# Patient Record
Sex: Female | Born: 1975
Health system: Southern US, Community
[De-identification: ages and names within clinical notes are randomized; demographics above are authoritative.]

## PROBLEM LIST (undated history)

## (undated) DIAGNOSIS — D649 Anemia, unspecified: Secondary | ICD-10-CM

## (undated) DIAGNOSIS — F419 Anxiety disorder, unspecified: Secondary | ICD-10-CM

## (undated) DIAGNOSIS — D219 Benign neoplasm of connective and other soft tissue, unspecified: Secondary | ICD-10-CM

## (undated) DIAGNOSIS — F32A Depression, unspecified: Secondary | ICD-10-CM

## (undated) DIAGNOSIS — F329 Major depressive disorder, single episode, unspecified: Secondary | ICD-10-CM

## (undated) HISTORY — PX: RECTAL EXAMINATION UNDER ANESTHESIA W/ CYSTOSCOPY: SHX2307

## (undated) HISTORY — DX: Anemia, unspecified: D64.9

## (undated) HISTORY — PX: MYOMECTOMY: SHX85

## (undated) HISTORY — DX: Major depressive disorder, single episode, unspecified: F32.9

## (undated) HISTORY — DX: Benign neoplasm of connective and other soft tissue, unspecified: D21.9

## (undated) HISTORY — DX: Anxiety disorder, unspecified: F41.9

## (undated) HISTORY — DX: Depression, unspecified: F32.A

---

## 1999-07-11 ENCOUNTER — Emergency Department (HOSPITAL_COMMUNITY): Admission: EM | Admit: 1999-07-11 | Discharge: 1999-07-11 | Payer: Self-pay | Admitting: Emergency Medicine

## 2002-08-05 ENCOUNTER — Emergency Department (HOSPITAL_COMMUNITY): Admission: EM | Admit: 2002-08-05 | Discharge: 2002-08-05 | Payer: Self-pay | Admitting: Emergency Medicine

## 2004-04-11 ENCOUNTER — Emergency Department (HOSPITAL_COMMUNITY): Admission: EM | Admit: 2004-04-11 | Discharge: 2004-04-11 | Payer: Self-pay | Admitting: Family Medicine

## 2004-04-13 ENCOUNTER — Emergency Department (HOSPITAL_COMMUNITY): Admission: EM | Admit: 2004-04-13 | Discharge: 2004-04-14 | Payer: Self-pay | Admitting: Emergency Medicine

## 2004-05-28 ENCOUNTER — Emergency Department (HOSPITAL_COMMUNITY): Admission: EM | Admit: 2004-05-28 | Discharge: 2004-05-28 | Payer: Self-pay | Admitting: *Deleted

## 2004-10-15 ENCOUNTER — Emergency Department (HOSPITAL_COMMUNITY): Admission: EM | Admit: 2004-10-15 | Discharge: 2004-10-15 | Payer: Self-pay | Admitting: Emergency Medicine

## 2004-12-09 ENCOUNTER — Emergency Department (HOSPITAL_COMMUNITY): Admission: EM | Admit: 2004-12-09 | Discharge: 2004-12-09 | Payer: Self-pay | Admitting: Emergency Medicine

## 2004-12-10 ENCOUNTER — Inpatient Hospital Stay (HOSPITAL_COMMUNITY): Admission: RE | Admit: 2004-12-10 | Discharge: 2004-12-13 | Payer: Self-pay | Admitting: General Surgery

## 2009-10-16 ENCOUNTER — Emergency Department (HOSPITAL_COMMUNITY): Admission: EM | Admit: 2009-10-16 | Discharge: 2009-10-16 | Payer: Self-pay | Admitting: Family Medicine

## 2009-11-13 ENCOUNTER — Ambulatory Visit: Payer: Self-pay | Admitting: Internal Medicine

## 2009-11-13 ENCOUNTER — Encounter (INDEPENDENT_AMBULATORY_CARE_PROVIDER_SITE_OTHER): Payer: Self-pay | Admitting: Family Medicine

## 2009-11-13 LAB — CONVERTED CEMR LAB
AST: 16 units/L (ref 0–37)
Albumin: 4.5 g/dL (ref 3.5–5.2)
Alkaline Phosphatase: 49 units/L (ref 39–117)
BUN: 8 mg/dL (ref 6–23)
Basophils Absolute: 0 10*3/uL (ref 0.0–0.1)
Creatinine, Ser: 0.58 mg/dL (ref 0.40–1.20)
Eosinophils Relative: 2 % (ref 0–5)
HCT: 40.8 % (ref 36.0–46.0)
HDL: 55 mg/dL (ref >39)
LDL Cholesterol: 95 mg/dL (ref 0–99)
Lymphocytes Relative: 32 % (ref 12–46)
Platelets: 268 10*3/uL (ref 150–400)
Potassium: 4.2 meq/L (ref 3.5–5.3)
RDW: 14.2 % (ref 11.5–15.5)
TSH: 0.919 microintl units/mL (ref 0.350–4.500)
Total CHOL/HDL Ratio: 2.9
VLDL: 12 mg/dL (ref 0–40)

## 2009-12-31 ENCOUNTER — Ambulatory Visit: Payer: Self-pay | Admitting: Internal Medicine

## 2010-02-03 ENCOUNTER — Emergency Department (HOSPITAL_COMMUNITY): Admission: EM | Admit: 2010-02-03 | Discharge: 2010-02-03 | Payer: Self-pay | Admitting: Family Medicine

## 2010-08-24 ENCOUNTER — Emergency Department (HOSPITAL_COMMUNITY)
Admission: EM | Admit: 2010-08-24 | Discharge: 2010-08-24 | Payer: Self-pay | Source: Home / Self Care | Admitting: Family Medicine

## 2010-09-22 ENCOUNTER — Encounter (INDEPENDENT_AMBULATORY_CARE_PROVIDER_SITE_OTHER): Payer: Self-pay | Admitting: *Deleted

## 2010-10-31 ENCOUNTER — Encounter (INDEPENDENT_AMBULATORY_CARE_PROVIDER_SITE_OTHER): Payer: Self-pay | Admitting: *Deleted

## 2010-10-31 LAB — CONVERTED CEMR LAB
ALT: 9 units/L (ref 0–35)
Alkaline Phosphatase: 45 units/L (ref 39–117)
LDL Cholesterol: 121 mg/dL — ABNORMAL HIGH (ref 0–99)
MCHC: 32 g/dL (ref 30.0–36.0)
MCV: 95.4 fL (ref 78.0–100.0)
Platelets: 227 10*3/uL (ref 150–400)
Sodium: 139 meq/L (ref 135–145)
Total Bilirubin: 0.4 mg/dL (ref 0.3–1.2)
Total CHOL/HDL Ratio: 3.5
Total Protein: 8 g/dL (ref 6.0–8.3)
Triglycerides: 65 mg/dL (ref <150)
VLDL: 13 mg/dL (ref 0–40)

## 2010-12-23 LAB — CULTURE, ROUTINE-ABSCESS: Gram Stain: NONE SEEN

## 2011-01-20 ENCOUNTER — Emergency Department (HOSPITAL_COMMUNITY)
Admission: EM | Admit: 2011-01-20 | Discharge: 2011-01-20 | Disposition: A | Discharge status: Home / Self Care | Payer: Self-pay | Attending: Emergency Medicine | Admitting: Emergency Medicine

## 2011-01-20 DIAGNOSIS — J029 Acute pharyngitis, unspecified: Secondary | ICD-10-CM | POA: Insufficient documentation

## 2011-02-27 NOTE — Discharge Summary (Signed)
NAMECHLOE, [name]            ACCOUNT NO.:  [id number]   MEDICAL RECORD NO.:  [id number]          PATIENT TYPE:  INP   LOCATION:  0373                         FACILITY:  [facility]   PHYSICIAN:  Anselm Pancoast. Weatherly, M.D.DATE OF BIRTH:  01-Jan-1976   DATE OF ADMISSION:  12/10/2004  DATE OF DISCHARGE:  12/13/2004                                 DISCHARGE SUMMARY   DISCHARGE DIAGNOSIS:  Complex perirectal abscess, ischiorectal component.   OPERATION:  Drainage of complex multiloculated perirectal abscess with  ischiorectal extension.   HISTORY:  [name] is a 35 year old black female who started having  pain and flu like symptoms about eight days ago and said that she has had a  history of previous constipation.  She was seen in the emergency room on  Friday and diagnosed as an anorectal tear and was placed on antibiotics plus  p.o. pain medicine.  She was asked to be seen in follow up in the office and  saw Dorisann Frames, M.D. on the day prior to coming to the ER for a CT scan.  She has a  horseshoe abscess on CT scan.  Vikki Ports, MD was on  call, had a very busy evening, and asked that I take care of the patient the  following day.  On examination, she had definite marked tenderness of the  perianal area and swelling.  She was febrile at 101 in the emergency room.  Her pulse was 86 and her white blood count was elevated to 19,800.  The  patient spent the night in the emergency room.  She had been started on  antibiotics and then the following morning, I saw her and added her to the  OR schedule promptly and at the time of surgery, she had a large perirectal  abscess that had a midline fistula in ano in internal opening and a probe  was placed opening it into the posterior perirectal abscess and then it went  around to the left and right and this was large enough, probably a lemon  size or larger in the perirectal with one area kind of going up into the  supralevator area.  I washed everything out, broke up the multiple  loculations between these various abscesses and placed two Robinson  catheters up in it for drainage and completed the surgery.  Postoperatively,  she was greatly improved.  We continued her on the broad antibiotic  coverage.  Her white count came down to normal, approximately less than  10,000 36 hours after surgery and she was ready for discharge and Sharlet Salina  T. Hoxworth, M.D. removed the little drains.  She was discharged to continue  on oral antibiotics and follow up in our office in 3-4 days.  She was  discharged on ampicillin 500 mg q.i.d. for 10 days, Flagyl 500 mg also  q.i.d. for 10 days, and Vicodin for pain.  She will see me in the office on  Tuesday or Wednesday for follow up.  The cultures showed multiple organisms  consistent with a colonic origin.      WJW/MEDQ  D:  01/15/2005  T:  01/15/2005  Job:  [id number]

## 2011-02-27 NOTE — Op Note (Signed)
Sabrina [name], Sabrina [name]            ACCOUNT NO.:  [id number]   MEDICAL RECORD NO.:  [id number]          PATIENT TYPE:  INP   LOCATION:  0102                         FACILITY:  [facility]   PHYSICIAN:  Anselm Pancoast. Weatherly, M.D.DATE OF BIRTH:  1976-05-27   DATE OF PROCEDURE:  12/11/2004  DATE OF DISCHARGE:                                 OPERATIVE REPORT   PREOPERATIVE DIAGNOSIS:  Perirectal abscess, complex, with supralevator and  ischiorectal components.   OPERATION:  Drainage of complex perirectal abscess with supralevator and  ischiorectal extensions.   ANESTHESIA:  General.   SURGEON:  Anselm Pancoast. Zachery Dakins, M.D.   HISTORY:  Sabrina [name] is a 35 year old female who was seen in the  emergency room about 5 days ago who thought she had the flu.  She had fever,  had a lot of anal discomfort with kind of vague localizing symptoms, and was  seen by Dr. Effie Shy.  The patient states that he described a tear within the  anus and she was placed on Augmentin and pain medication and advised to see  a Careers adviser at [facility].  I am not sure why there was about a  3-day delay, but she was seen in the office yesterday by Dr. Lurene Shadow, who on  examination called and talked to the patient's father but then scheduled a  CT scan which was done at [facility] and this showed an obvious  supralevator and kind of an ischiorectal component predominantly on the left  with a little bit across the midline posterior and Dr. Luan Pulling was on  call.  The patient, however, had left the ER and gone to the cafeteria.  When Dr. Luan Pulling saw her, it was probably midnight or so, and she had  just eaten.  Anyway, she was started on IV antibiotics but surgery delayed  until a 6-hour period of time.  I was asked to see her this morning and on  examination, she is certainly having a lot of sphincter spasm and there is  frank purulence coming from the anus.  Of course, I reviewed the CT and  could see the  large components up high in the perirectal area.  I discussed  with her that I would be doing her drainage and discussed with her that this  would require an internal sphincterotomy or possibly not being able to  control gas or solid or liquid stool initially to drain this area.  The  patient was in agreement and she was given an additional dose of Cipro and  taken to the operative suite.   DESCRIPTION OF PROCEDURE:  First she was placed up in stirrups after  induction of general anesthesia and you could see the frank purulence coming  from a midline fistula in ano opening.  First a probe was placed in this and  it does go up into the perirectal space high.  There is very little, if any,  actual extension in the perianal area like typical perirectal abscesses.  I  made a little opening posterior midline and actually went around the  internal sphincter area and then this completely divided  that so I could  then get my finger in and really with an [employer] retracted this up and  there was a large, probably lemon size or larger, frank pocket of purulence  to the left posterior.  I divided some of the smaller area of the  musculature of the pelvic floor so that there would be good drainage of this  and then placed a Robinson catheter up in this area and irrigated it while  looking into the anus and I do not see a communication in the perirectal  area.  I think this all started at the dentate line posterior midline.  As  far as the area to the right side, I cannot feel any other actual pockets  and I think that the communication does go posterior so I think that this  adequately drains the area.  I did place two 3/4-inch Penrose drains up into  the perirectal area up into the supralevator space and then sutured it to  the anal mucosa with 2-0 chromics to hopefully keep the Penrose drains in  for a couple of days or so to allow the tract to be established.  I may  elect to do a CT in a couple  of days for followup to make sure that there  are no other areas that are not drained according to how clinically she is  doing.  I then had the anesthesiologist kind of cut off the anesthetic  agents and waited so that she was getting good muscular time to make sure  that I do have the internal sphincter completely drained so that this will  have free drainage access and I am comfortable with that.  I was  conservative on dividing the sphincter so that hopefully we will have  continence in just a few days providing it is not gas or diarrhea.  With the  patient awakened, there was no evidence of any other frank pus draining out  as if there were any undrained areas.  The patient tolerated the procedure  nicely.  I am going to keep her on Cipro and add Flagyl orally and will make  the decision tomorrow whether she can possibly be discharged or whether she  will need another 48 hours on antibiotics.      WJW/MEDQ  D:  12/11/2004  T:  12/11/2004  Job:  [id number]

## 2011-11-11 ENCOUNTER — Emergency Department (HOSPITAL_COMMUNITY): Payer: Self-pay

## 2011-11-11 ENCOUNTER — Encounter (HOSPITAL_COMMUNITY): Payer: Self-pay | Admitting: *Deleted

## 2011-11-11 ENCOUNTER — Emergency Department (HOSPITAL_COMMUNITY)
Admission: EM | Admit: 2011-11-11 | Discharge: 2011-11-11 | Disposition: A | Discharge status: Home / Self Care | Payer: Self-pay | Attending: Emergency Medicine | Admitting: Emergency Medicine

## 2011-11-11 DIAGNOSIS — R002 Palpitations: Secondary | ICD-10-CM | POA: Insufficient documentation

## 2011-11-11 DIAGNOSIS — N390 Urinary tract infection, site not specified: Secondary | ICD-10-CM | POA: Insufficient documentation

## 2011-11-11 DIAGNOSIS — Z79899 Other long term (current) drug therapy: Secondary | ICD-10-CM | POA: Insufficient documentation

## 2011-11-11 DIAGNOSIS — R0602 Shortness of breath: Secondary | ICD-10-CM | POA: Insufficient documentation

## 2011-11-11 DIAGNOSIS — R110 Nausea: Secondary | ICD-10-CM | POA: Insufficient documentation

## 2011-11-11 DIAGNOSIS — F411 Generalized anxiety disorder: Secondary | ICD-10-CM | POA: Insufficient documentation

## 2011-11-11 LAB — URINALYSIS, ROUTINE W REFLEX MICROSCOPIC
Leukocytes, UA: NEGATIVE
Specific Gravity, Urine: 1.02 (ref 1.005–1.030)
Urobilinogen, UA: 1 mg/dL (ref 0.0–1.0)

## 2011-11-11 LAB — POCT I-STAT, CHEM 8
Calcium, Ion: 1.21 mmol/L (ref 1.12–1.32)
Chloride: 105 mEq/L (ref 96–112)
Glucose, Bld: 78 mg/dL (ref 70–99)
HCT: 36 % (ref 36.0–46.0)
Hemoglobin: 12.2 g/dL (ref 12.0–15.0)
TCO2: 26 mmol/L (ref 0–100)

## 2011-11-11 LAB — URINE MICROSCOPIC-ADD ON

## 2011-11-11 LAB — PREGNANCY, URINE: Preg Test, Ur: NEGATIVE

## 2011-11-11 LAB — D-DIMER, QUANTITATIVE: D-Dimer, Quant: 0.22 ug/mL-FEU (ref 0.00–0.48)

## 2011-11-11 MED ORDER — CEPHALEXIN 500 MG PO CAPS: 500.0000 mg | ORAL_CAPSULE | Freq: Four times a day (QID) | ORAL | Status: AC

## 2011-11-11 NOTE — ED Notes (Signed)
Pt worried about possible pregnancy and taking effexor.

## 2011-11-11 NOTE — ED Notes (Signed)
Pt reports onset last night of palpitations and feeling sob. Pt is afraid that its related to possible pregnancy while taking her effexor? No resp distress noted at triage, speaking in full sentences.

## 2011-11-11 NOTE — ED Provider Notes (Signed)
History     CSN: [id number]  Arrival date & time 11/11/11  1153   First MD Initiated Contact with Patient 11/11/11 1226      Chief Complaint  Patient presents with  . Shortness of Breath  . Palpitations    (Consider location/radiation/quality/duration/timing/severity/associated sxs/prior treatment) HPI Comments: Patient with intermittent palpitations and shortness of breath that started last night. She has episodes of heart racing it lasted for several minutes at a time. They're associated with anxiety and nausea. She denies any chest pain. Denies any cough or fever. She is worried that she might be pregnant because she is on Effexor. Denies abdominal pain, nausea vomiting. No irregular vaginal bleeding. No leg pain or swelling. Denies any caffeine or energy drink use.  The history is provided by the patient and the spouse.    History reviewed. No pertinent past medical history.  History reviewed. No pertinent past surgical history.  History reviewed. No pertinent family history.  History  Substance Use Topics  . Smoking status: Current Everyday Smoker    Types: Cigarettes  . Smokeless tobacco: Not on file  . Alcohol Use: No    OB History    Grav Para Term Preterm Abortions TAB SAB Ect Mult Living                  Review of Systems  Constitutional: Negative for fever, activity change and appetite change.  HENT: Negative for congestion and rhinorrhea.   Respiratory: Positive for shortness of breath.   Cardiovascular: Positive for palpitations. Negative for chest pain.  Gastrointestinal: Negative for nausea, vomiting and abdominal pain.  Genitourinary: Negative for dysuria and hematuria.  Skin: Negative for rash.  Neurological: Negative for headaches.    Allergies  Review of patient's allergies indicates no known allergies.  Home Medications   Current Outpatient Rx  Name Route Sig Dispense Refill  . ADULT MULTIVITAMIN W/MINERALS CH Oral Take 1 tablet by mouth  at bedtime.    . VENLAFAXINE HCL ER 37.5 MG PO CP24 Oral Take 37.5 mg by mouth daily.    . CEPHALEXIN 500 MG PO CAPS Oral Take 1 capsule (500 mg total) by mouth 4 (four) times daily. 40 capsule 0    BP 95/57  Pulse 73  Temp(Src) 98.5 F (36.9 C) (Oral)  Resp 20  SpO2 100%  LMP 10/27/2011  Physical Exam  Constitutional: She is oriented to person, place, and time. She appears well-developed and well-nourished. No distress.  HENT:  Head: Normocephalic and atraumatic.  Mouth/Throat: Oropharynx is clear and moist. No oropharyngeal exudate.  Eyes: Conjunctivae are normal. Pupils are equal, round, and reactive to light.  Neck: Normal range of motion.  Cardiovascular: Normal rate, regular rhythm and normal heart sounds.   No murmur heard. Pulmonary/Chest: Effort normal and breath sounds normal. No respiratory distress.  Abdominal: Soft. There is no tenderness. There is no rebound and no guarding.  Musculoskeletal: Normal range of motion. She exhibits no edema and no tenderness.  Neurological: She is alert and oriented to person, place, and time. No cranial nerve deficit.  Skin: Skin is warm.    ED Course  Procedures (including critical care time)  Labs Reviewed  URINALYSIS, ROUTINE W REFLEX MICROSCOPIC - Abnormal; Notable for the following:    APPearance CLOUDY (*)    Hgb urine dipstick MODERATE (*)    Nitrite POSITIVE (*)    All other components within normal limits  URINE MICROSCOPIC-ADD ON - Abnormal; Notable for the following:  Squamous Epithelial / LPF FEW (*)    Bacteria, UA MANY (*)    All other components within normal limits  PREGNANCY, URINE  D-DIMER, QUANTITATIVE  POCT I-STAT, CHEM 8  TSH  T4, FREE   Dg Chest 2 View  11/11/2011  *RADIOLOGY REPORT*  Clinical Data: Tachycardia.  Short of breath.  CHEST - 2 VIEW  Comparison: None.  Findings:  Cardiopericardial silhouette within normal limits. Mediastinal contours normal. Trachea midline.  No airspace disease or  effusion.  IMPRESSION: No active cardiopulmonary disease.  Original Report Authenticated By: Andreas Newport, M.D.     1. Palpitations   2. Urinary tract infection       MDM  Intermittent palpitations shortness of breath. No respiratory distress, no chest pain, lungs clear.  Chest x-ray negative. D-dimer negative. No further palpitations.  Urinalysis with infection. Will treat. Discussed with patient followup with PCP in cardiology as needed for possible Holter monitor if palpitations persist.   Date: 11/11/2011  Rate: 74  Rhythm: normal sinus rhythm  QRS Axis: normal  Intervals: PR prolonged  ST/T Wave abnormalities: normal  Conduction Disutrbances:none  Narrative Interpretation:   Old EKG Reviewed: none available          Glynn Octave, MD 11/11/11 [phone]

## 2012-11-25 ENCOUNTER — Emergency Department (HOSPITAL_COMMUNITY): Payer: Self-pay

## 2012-11-25 ENCOUNTER — Emergency Department (HOSPITAL_COMMUNITY)
Admission: EM | Admit: 2012-11-25 | Discharge: 2012-11-25 | Disposition: A | Discharge status: Home / Self Care | Payer: Self-pay | Attending: Emergency Medicine | Admitting: Emergency Medicine

## 2012-11-25 ENCOUNTER — Encounter (HOSPITAL_COMMUNITY): Payer: Self-pay | Admitting: Emergency Medicine

## 2012-11-25 DIAGNOSIS — Z3202 Encounter for pregnancy test, result negative: Secondary | ICD-10-CM | POA: Insufficient documentation

## 2012-11-25 DIAGNOSIS — D259 Leiomyoma of uterus, unspecified: Secondary | ICD-10-CM

## 2012-11-25 DIAGNOSIS — F17200 Nicotine dependence, unspecified, uncomplicated: Secondary | ICD-10-CM | POA: Insufficient documentation

## 2012-11-25 DIAGNOSIS — N898 Other specified noninflammatory disorders of vagina: Secondary | ICD-10-CM | POA: Insufficient documentation

## 2012-11-25 LAB — WET PREP, GENITAL
Trich, Wet Prep: NONE SEEN
Yeast Wet Prep HPF POC: NONE SEEN

## 2012-11-25 LAB — URINALYSIS, ROUTINE W REFLEX MICROSCOPIC
Bilirubin Urine: NEGATIVE
Glucose, UA: NEGATIVE mg/dL
Specific Gravity, Urine: 1.024 (ref 1.005–1.030)
pH: 6 (ref 5.0–8.0)

## 2012-11-25 LAB — POCT PREGNANCY, URINE: Preg Test, Ur: NEGATIVE

## 2012-11-25 MED ORDER — METRONIDAZOLE 500 MG PO TABS: 500.0000 mg | ORAL_TABLET | Freq: Two times a day (BID) | ORAL | Status: DC

## 2012-11-25 NOTE — ED Notes (Signed)
Pt st's she has had a knot at her umbilicus for 1 yr.  Pt denies any pain or discomfort, st's she just wanted to get it checked and get an x-ray.

## 2012-11-25 NOTE — ED Provider Notes (Signed)
History    This chart was scribed for non-physician practitioner working with Carleene Cooper III, MD by Frederik Pear, ED Scribe. This patient was seen in room TR09C/TR09C and the patient's care was started at 1708.   CSN: [id number]  Arrival date & time 11/25/12  1544   First MD Initiated Contact with Patient 11/25/12 1708      Chief Complaint  Patient presents with  . Abdominal Pain    (Consider location/radiation/quality/duration/timing/severity/associated sxs/prior treatment) The history is provided by the patient. No language interpreter was used.    Sabrina [name] is a 37 y.o. female who presents to the Emergency Department complaining of gradual onset, gradually worsening, non-radiating, area of firmness without pain near the suprapubic region that gets more firm during her monthly period and began a year ago. She also complains of constant, gradually worsening vaginal discharge that began a year ago. She reports that she was treated for trichomoniasis earlier in the year, and the vaginal discharge seemed to improved for awhile before gradually worsening. She reports that at the time she was treated that she was in a monogamous relationship with a same sex partner that has since ended. She denies any constipation, diarrhea, hematochezia, vaginal bleeding, or emesis. She has a h/o of heavy monthly periods, but denies any previous diagnosis of fibroids.   No OBGYN or PCP.   History reviewed. No pertinent past medical history.  Past Surgical History  Procedure Laterality Date  . Rectal examination under anesthesia w/ cystoscopy      No family history on file.  History  Substance Use Topics  . Smoking status: Current Every Day Smoker    Types: Cigarettes  . Smokeless tobacco: Not on file  . Alcohol Use: No    OB History   Grav Para Term Preterm Abortions TAB SAB Ect Mult Living                  Review of Systems  Constitutional: Negative for fever.   Gastrointestinal: Negative for vomiting, constipation and blood in stool.  Genitourinary: Positive for vaginal discharge.  All other systems reviewed and are negative.    Allergies  Review of patient's allergies indicates no known allergies.  Home Medications   Current Outpatient Rx  Name  Route  Sig  Dispense  Refill  . ibuprofen (ADVIL,MOTRIN) 200 MG tablet   Oral   Take 400 mg by mouth every 6 (six) hours as needed for pain.         . Multiple Vitamin (MULITIVITAMIN WITH MINERALS) TABS   Oral   Take 1 tablet by mouth daily.            BP 111/70  Pulse 60  Temp(Src) 98.3 F (36.8 C) (Oral)  Resp 16  SpO2 98%  LMP 11/18/2012  Physical Exam  Nursing note and vitals reviewed. Constitutional: She is oriented to person, place, and time. She appears well-developed and well-nourished.  HENT:  Head: Normocephalic and atraumatic.  Neck: Normal range of motion.  Pulmonary/Chest: Effort normal. No respiratory distress.  Abdominal: Soft. Bowel sounds are normal. She exhibits mass. She exhibits no distension. There is no tenderness. There is no rebound and no guarding.  Genitourinary: Pelvic exam was performed with patient supine. There is no rash on the right labia. There is no rash on the left labia. Uterus is enlarged (mass). Uterus is not tender. Cervix exhibits no motion tenderness and no discharge. Right adnexum displays no mass and no tenderness. Left adnexum displays no  mass and no tenderness. Vaginal discharge (thick white) found.  Musculoskeletal: Normal range of motion.  Neurological: She is alert and oriented to person, place, and time.  Skin: Skin is warm.  Psychiatric: She has a normal mood and affect. Thought content normal.    ED Course  Procedures (including critical care time)  DIAGNOSTIC STUDIES: Oxygen Saturation is 98% on room air, normal by my interpretation.    COORDINATION OF CARE:  17:36- Discussed planned course of treatment with the patient,  including a GC/chlamydia probe, who is agreeable at this time.  18:23- Ordered a transvaginal ultrasound.  Results for orders placed during the hospital encounter of 11/25/12  WET PREP, GENITAL      Result Value Range   Yeast Wet Prep HPF POC NONE SEEN  NONE SEEN   Trich, Wet Prep NONE SEEN  NONE SEEN   Clue Cells Wet Prep HPF POC FEW (*) NONE SEEN   WBC, Wet Prep HPF POC FEW (*) NONE SEEN  URINALYSIS, ROUTINE W REFLEX MICROSCOPIC      Result Value Range   Color, Urine YELLOW  YELLOW   APPearance CLOUDY (*) CLEAR   Specific Gravity, Urine 1.024  1.005 - 1.030   pH 6.0  5.0 - 8.0   Glucose, UA NEGATIVE  NEGATIVE mg/dL   Hgb urine dipstick MODERATE (*) NEGATIVE   Bilirubin Urine NEGATIVE  NEGATIVE   Ketones, ur NEGATIVE  NEGATIVE mg/dL   Protein, ur NEGATIVE  NEGATIVE mg/dL   Urobilinogen, UA 0.2  0.0 - 1.0 mg/dL   Nitrite POSITIVE (*) NEGATIVE   Leukocytes, UA NEGATIVE  NEGATIVE  URINE MICROSCOPIC-ADD ON      Result Value Range   Squamous Epithelial / LPF MANY (*) RARE   WBC, UA 0-2  <3 WBC/hpf   RBC / HPF 3-6  <3 RBC/hpf   Bacteria, UA MANY (*) RARE  POCT PREGNANCY, URINE      Result Value Range   Preg Test, Ur NEGATIVE  NEGATIVE     Labs Reviewed  WET PREP, GENITAL - Abnormal; Notable for the following:    Clue Cells Wet Prep HPF POC FEW (*)    WBC, Wet Prep HPF POC FEW (*)    All other components within normal limits  URINALYSIS, ROUTINE W REFLEX MICROSCOPIC - Abnormal; Notable for the following:    APPearance CLOUDY (*)    Hgb urine dipstick MODERATE (*)    Nitrite POSITIVE (*)    All other components within normal limits  URINE MICROSCOPIC-ADD ON - Abnormal; Notable for the following:    Squamous Epithelial / LPF MANY (*)    Bacteria, UA MANY (*)    All other components within normal limits  GC/CHLAMYDIA PROBE AMP  POCT PREGNANCY, URINE   US Pelvis Complete  11/25/2012  *RADIOLOGY REPORT*  Clinical Data: Uterine mass.  Question enlarged fibroid.   TRANSABDOMINAL ULTRASOUND OF PELVIS  Technique: Transabdominal ultrasound examination of the pelvis was performed including evaluation of the uterus, ovaries, adnexal regions, and pelvic cul-de-sac.  Comparison: CT 12/10/2004  Findings:  Uterus: 13.6 x 9.3 x 10.4 cm.  Multiple fibroids throughout the uterus.  The largest is in the right myometrium measuring up to 5.7 cm.  Another large fibroid in the fundus at the midline measuring up to 5.5 cm. Multiple other fibroids from 2-5 cm.  Endometrium: Not well visualized due to heterogeneous echotexture throughout the uterus and fibroids.  Right Ovary: 3.1 x 2.0 x 2.4 cm. Normal size and echotexture.  No adnexal masses.  Left Ovary: 4.3 x 1.6 x 2.5 cm. Normal size and echotexture.  No adnexal masses.  Other Findings:  No free fluid.  IMPRESSION: Enlarged uterus with heterogeneous echotexture and multiple focal fibroids, the largest 5.7 cm.   Original Report Authenticated By: Charlett Nose, M.D.      1. Leiomyoma of uterus     8:07 PM Patient seen and examined.   Vital signs reviewed and are as follows: Filed Vitals:   11/25/12 1619  BP: 111/70  Pulse: 60  Temp: 98.3 F (36.8 C)  Resp: 16   [place] ordered to evaluate uterine mass.   Pelvic exam performed with nurse chaperone.  Findings reviewed. Patient informed. GYN followup/referral given.  Patient informed of bacterial vaginosis. Will treat.  Patient urged to return with worsening symptoms or any other concerns.   MDM  Patient with suprapubic mass, confirmed to be fibroids on pelvic ultrasound. No concern for obstruction. Abdomen is soft and nontender.  I personally performed the services described in this documentation, which was scribed in my presence. The recorded information has been reviewed and is accurate.        Renne Crigler, [place] 11/25/12 2055

## 2012-11-26 LAB — GC/CHLAMYDIA PROBE AMP
CT Probe RNA: NEGATIVE
GC Probe RNA: NEGATIVE

## 2012-11-28 NOTE — ED Provider Notes (Signed)
Medical screening examination/treatment/procedure(s) were performed by non-physician practitioner and as supervising physician I was immediately available for consultation/collaboration.   Carleene Cooper III, MD 11/28/12 [phone]

## 2013-07-17 ENCOUNTER — Other Ambulatory Visit (HOSPITAL_COMMUNITY)
Admission: RE | Admit: 2013-07-17 | Discharge: 2013-07-17 | Disposition: A | Discharge status: Home / Self Care | Payer: Self-pay | Source: Ambulatory Visit | Attending: Obstetrics and Gynecology | Admitting: Obstetrics and Gynecology

## 2013-07-17 ENCOUNTER — Other Ambulatory Visit: Payer: Self-pay | Admitting: Obstetrics and Gynecology

## 2013-07-17 DIAGNOSIS — Z01419 Encounter for gynecological examination (general) (routine) without abnormal findings: Secondary | ICD-10-CM | POA: Insufficient documentation

## 2013-07-17 DIAGNOSIS — Z113 Encounter for screening for infections with a predominantly sexual mode of transmission: Secondary | ICD-10-CM | POA: Insufficient documentation

## 2013-10-13 ENCOUNTER — Ambulatory Visit (INDEPENDENT_AMBULATORY_CARE_PROVIDER_SITE_OTHER): Payer: 59 | Admitting: Physician Assistant

## 2013-10-13 VITALS — BP 108/62 | HR 62 | Temp 98.1°F | Resp 18 | Ht 64.0 in | Wt 150.0 lb

## 2013-10-13 DIAGNOSIS — D219 Benign neoplasm of connective and other soft tissue, unspecified: Secondary | ICD-10-CM | POA: Insufficient documentation

## 2013-10-13 DIAGNOSIS — G4700 Insomnia, unspecified: Secondary | ICD-10-CM

## 2013-10-13 DIAGNOSIS — F411 Generalized anxiety disorder: Secondary | ICD-10-CM

## 2013-10-13 MED ORDER — VENLAFAXINE HCL ER 37.5 MG PO CP24: 37.5000 mg | ORAL_CAPSULE | Freq: Every day | ORAL | Status: DC

## 2013-10-13 NOTE — Patient Instructions (Addendum)
Start with 1 pill a day and then if you are tolerating the medication after 7-10 days increase to 2 pills once a day - if you are having side effects stay at 1 pill/  Recheck with me in a month  Arvil Chaco - [phone]
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
## 2013-10-16 ENCOUNTER — Telehealth: Payer: Self-pay

## 2013-10-16 NOTE — Telephone Encounter (Signed)
Patient calling to let Sabrina [name] as requested, know how the medication venlafaxine XR (EFFEXOR XR) 37.5 MG 24 hr capsule Gave her symptoms of heart racing and was very unsettling for her around 2-4am. She was wondering if maybe she can be switched to another type of medication? Something more mild.     [phone]

## 2013-10-17 ENCOUNTER — Other Ambulatory Visit: Payer: Self-pay | Admitting: Radiology

## 2013-10-17 ENCOUNTER — Telehealth: Payer: Self-pay

## 2013-10-17 MED ORDER — BUPROPION HCL ER (XL) 150 MG PO TB24: 150.0000 mg | ORAL_TABLET | Freq: Every day | ORAL | Status: DC

## 2013-10-17 NOTE — Telephone Encounter (Signed)
Ok lets give it a try.  I think the increased heart rate is that side effect I was telling her about with the increased anxiety when starting on the effexor.  I have sent in the Wellbutrin that she requested to try and I will see her back before the medication runs out.

## 2013-10-17 NOTE — Telephone Encounter (Signed)
Patient calling again to speak to Bernestine Amass about previous message in regards to a reaction to her medication venlafaxine XR (EFFEXOR XR) 37.5 MG 24 hr capsule    [phone]

## 2013-10-17 NOTE — Telephone Encounter (Signed)
Patient reports side effects with the Effexor, heart racing. Please advise on alternative, advised her to d/c this medication.

## 2013-10-17 NOTE — Telephone Encounter (Signed)
She is interested in Wellbutrin instead , please advise. She states the heart racing has resolved, agrees to return to clinic if this returns. She will take it easy and increase fluids today.

## 2013-12-12 ENCOUNTER — Ambulatory Visit (INDEPENDENT_AMBULATORY_CARE_PROVIDER_SITE_OTHER): Payer: No Typology Code available for payment source | Admitting: Internal Medicine

## 2013-12-12 VITALS — BP 110/60 | HR 64 | Temp 98.1°F | Resp 16 | Ht 64.25 in | Wt 143.6 lb

## 2013-12-12 DIAGNOSIS — J029 Acute pharyngitis, unspecified: Secondary | ICD-10-CM

## 2013-12-12 DIAGNOSIS — R05 Cough: Secondary | ICD-10-CM

## 2013-12-12 DIAGNOSIS — J209 Acute bronchitis, unspecified: Secondary | ICD-10-CM

## 2013-12-12 DIAGNOSIS — Z20828 Contact with and (suspected) exposure to other viral communicable diseases: Secondary | ICD-10-CM

## 2013-12-12 DIAGNOSIS — R059 Cough, unspecified: Secondary | ICD-10-CM

## 2013-12-12 LAB — POCT INFLUENZA A/B
INFLUENZA A, POC: NEGATIVE
Influenza B, POC: NEGATIVE

## 2013-12-12 MED ORDER — HYDROCODONE-ACETAMINOPHEN 7.5-325 MG/15ML PO SOLN: ORAL | Status: DC

## 2013-12-12 MED ORDER — AZITHROMYCIN 500 MG PO TABS: 500.0000 mg | ORAL_TABLET | Freq: Every day | ORAL | Status: DC

## 2013-12-12 NOTE — Patient Instructions (Signed)
Sinusitis Sinusitis is redness, soreness, and swelling (inflammation) of the paranasal sinuses. Paranasal sinuses are air pockets within the bones of your face (beneath the eyes, the middle of the forehead, or above the eyes). In healthy paranasal sinuses, mucus is able to drain out, and air is able to circulate through them by way of your nose. However, when your paranasal sinuses are inflamed, mucus and air can become trapped. This can allow bacteria and other germs to grow and cause infection. Sinusitis can develop quickly and last only a short time (acute) or continue over a long period (chronic). Sinusitis that lasts for more than 12 weeks is considered chronic.  CAUSES  Causes of sinusitis include:  Allergies.  Structural abnormalities, such as displacement of the cartilage that separates your nostrils (deviated septum), which can decrease the air flow through your nose and sinuses and affect sinus drainage.  Functional abnormalities, such as when the small hairs (cilia) that line your sinuses and help remove mucus do not work properly or are not present. SYMPTOMS  Symptoms of acute and chronic sinusitis are the same. The primary symptoms are pain and pressure around the affected sinuses. Other symptoms include:  Upper toothache.  Earache.  Headache.  Bad breath.  Decreased sense of smell and taste.  A cough, which worsens when you are lying flat.  Fatigue.  Fever.  Thick drainage from your nose, which often is green and may contain pus (purulent).  Swelling and warmth over the affected sinuses. DIAGNOSIS  Your caregiver will perform a physical exam. During the exam, your caregiver may:  Look in your nose for signs of abnormal growths in your nostrils (nasal polyps).  Tap over the affected sinus to check for signs of infection.  View the inside of your sinuses (endoscopy) with a special imaging device with a light attached (endoscope), which is inserted into your  sinuses. If your caregiver suspects that you have chronic sinusitis, one or more of the following tests may be recommended:  Allergy tests.  Nasal culture A sample of mucus is taken from your nose and sent to a lab and screened for bacteria.  Nasal cytology A sample of mucus is taken from your nose and examined by your caregiver to determine if your sinusitis is related to an allergy. TREATMENT  Most cases of acute sinusitis are related to a viral infection and will resolve on their own within 10 days. Sometimes medicines are prescribed to help relieve symptoms (pain medicine, decongestants, nasal steroid sprays, or saline sprays).  However, for sinusitis related to a bacterial infection, your caregiver will prescribe antibiotic medicines. These are medicines that will help kill the bacteria causing the infection.  Rarely, sinusitis is caused by a fungal infection. In theses cases, your caregiver will prescribe antifungal medicine. For some cases of chronic sinusitis, surgery is needed. Generally, these are cases in which sinusitis recurs more than 3 times per year, despite other treatments. HOME CARE INSTRUCTIONS   Drink plenty of water. Water helps thin the mucus so your sinuses can drain more easily.  Use a humidifier.  Inhale steam 3 to 4 times a day (for example, sit in the bathroom with the shower running).  Apply a warm, moist washcloth to your face 3 to 4 times a day, or as directed by your caregiver.  Use saline nasal sprays to help moisten and clean your sinuses.  Take over-the-counter or prescription medicines for pain, discomfort, or fever only as directed by your caregiver. SEEK IMMEDIATE MEDICAL   CARE IF:  You have increasing pain or severe headaches.  You have nausea, vomiting, or drowsiness.  You have swelling around your face.  You have vision problems.  You have a stiff neck.  You have difficulty breathing. MAKE SURE YOU:   Understand these  instructions.  Will watch your condition.  Will get help right away if you are not doing well or get worse. Document Released: 09/28/2005 Document Revised: 12/21/2011 Document Reviewed: 10/13/2011 [facility] Patient Information 2014 [place], [place]. Sore Throat A sore throat is pain, burning, irritation, or scratchiness of the throat. There is often pain or tenderness when swallowing or talking. A sore throat may be accompanied by other symptoms, such as coughing, sneezing, fever, and swollen neck glands. A sore throat is often the first sign of another sickness, such as a cold, flu, strep throat, or mononucleosis (commonly known as mono). Most sore throats go away without medical treatment. CAUSES  The most common causes of a sore throat include:  A viral infection, such as a cold, flu, or mono.  A bacterial infection, such as strep throat, tonsillitis, or whooping cough.  Seasonal allergies.  Dryness in the air.  Irritants, such as smoke or pollution.  Gastroesophageal reflux disease (GERD). HOME CARE INSTRUCTIONS   Only take over-the-counter medicines as directed by your caregiver.  Drink enough fluids to keep your urine clear or pale yellow.  Rest as needed.  Try using throat sprays, lozenges, or sucking on hard candy to ease any pain (if older than 4 years or as directed).  Sip warm liquids, such as broth, herbal tea, or warm water with honey to relieve pain temporarily. You may also eat or drink cold or frozen liquids such as frozen ice pops.  Gargle with salt water (mix 1 tsp salt with 8 oz of water).  Do not smoke and avoid secondhand smoke.  Put a cool-mist humidifier in your bedroom at night to moisten the air. You can also turn on a hot shower and sit in the bathroom with the door closed for 5 10 minutes. SEEK IMMEDIATE MEDICAL CARE IF:  You have difficulty breathing.  You are unable to swallow fluids, soft foods, or your saliva.  You have increased swelling in  the throat.  Your sore throat does not get better in 7 days.  You have nausea and vomiting.  You have a fever or persistent symptoms for more than 2 3 days.  You have a fever and your symptoms suddenly get worse. MAKE SURE YOU:   Understand these instructions.  Will watch your condition.  Will get help right away if you are not doing well or get worse. Document Released: 11/05/2004 Document Revised: 09/14/2012 Document Reviewed: 06/05/2012 [facility] Patient Information 2014 [place], [place].

## 2013-12-12 NOTE — Progress Notes (Signed)
   Subjective:    Patient ID: Sabrina [name], female    DOB: 1976-07-30, 38 y.o.   MRN: [id number]  HPI 38 YO female presents to the clinic today with "flu like" symptoms that started Sunday night. She states she is having a sore throat, congestion in her nasal area and chest, body aches, chills, sneezing. She has not checked her temperature but has had shivers and hot flashes. The largest complaint is her sore throat. Has cough and exposed to flu at work.  She denies any GI symptoms. She reports some soft stools and nausea but this is inconsistent.   She did not have her flu shot this year. She works at [employer] and may have been in contact with someone that has had the flu.     Review of Systems  Constitutional: Positive for chills and fatigue.  HENT: Positive for congestion and postnasal drip.   Respiratory: Positive for cough.   Genitourinary: Negative.        Objective:   Physical Exam  Vitals reviewed. Constitutional: She is oriented to person, place, and time. She appears well-developed and well-nourished. No distress.  HENT:  Head: Normocephalic.  Right Ear: External ear normal.  Left Ear: External ear normal.  Nose: Mucosal edema, rhinorrhea and sinus tenderness present.  Mouth/Throat: Posterior oropharyngeal erythema present.  Eyes: EOM are normal. Pupils are equal, round, and reactive to light.  Neck: Normal range of motion. Neck supple.  Cardiovascular: Normal rate.   Pulmonary/Chest: Effort normal and breath sounds normal.  Musculoskeletal: Normal range of motion.  Neurological: She is alert and oriented to person, place, and time. She exhibits normal muscle tone. Coordination normal.  Psychiatric: She has a normal mood and affect. Her behavior is normal. Thought content normal.    Results for orders placed in visit on 12/12/13  POCT INFLUENZA A/B      Result Value Ref Range   Influenza A, POC Negative     Influenza B, POC Negative             Assessment & Plan:  Cough/Pharyngitis Early bronchitis Zith 500mg Alvina Filbert

## 2013-12-13 ENCOUNTER — Other Ambulatory Visit (HOSPITAL_COMMUNITY): Payer: Self-pay | Admitting: Interventional Radiology

## 2013-12-13 DIAGNOSIS — D259 Leiomyoma of uterus, unspecified: Secondary | ICD-10-CM

## 2014-01-25 ENCOUNTER — Inpatient Hospital Stay: Admission: RE | Admit: 2014-01-25 | Payer: Self-pay | Source: Ambulatory Visit

## 2014-06-21 ENCOUNTER — Ambulatory Visit (INDEPENDENT_AMBULATORY_CARE_PROVIDER_SITE_OTHER): Payer: No Typology Code available for payment source | Admitting: Family Medicine

## 2014-06-21 VITALS — BP 108/64 | HR 60 | Temp 97.9°F | Resp 18 | Ht 64.0 in | Wt 142.6 lb

## 2014-06-21 DIAGNOSIS — A5901 Trichomonal vulvovaginitis: Secondary | ICD-10-CM

## 2014-06-21 DIAGNOSIS — R350 Frequency of micturition: Secondary | ICD-10-CM

## 2014-06-21 DIAGNOSIS — R5383 Other fatigue: Secondary | ICD-10-CM

## 2014-06-21 DIAGNOSIS — N3001 Acute cystitis with hematuria: Secondary | ICD-10-CM

## 2014-06-21 DIAGNOSIS — R5381 Other malaise: Secondary | ICD-10-CM

## 2014-06-21 DIAGNOSIS — D509 Iron deficiency anemia, unspecified: Secondary | ICD-10-CM

## 2014-06-21 DIAGNOSIS — D259 Leiomyoma of uterus, unspecified: Secondary | ICD-10-CM

## 2014-06-21 DIAGNOSIS — Z113 Encounter for screening for infections with a predominantly sexual mode of transmission: Secondary | ICD-10-CM

## 2014-06-21 DIAGNOSIS — IMO0001 Reserved for inherently not codable concepts without codable children: Secondary | ICD-10-CM

## 2014-06-21 DIAGNOSIS — N3000 Acute cystitis without hematuria: Secondary | ICD-10-CM

## 2014-06-21 DIAGNOSIS — M791 Myalgia, unspecified site: Secondary | ICD-10-CM

## 2014-06-21 LAB — POCT URINALYSIS DIPSTICK
Bilirubin, UA: NEGATIVE
Glucose, UA: NEGATIVE
NITRITE UA: POSITIVE
PH UA: 7
Protein, UA: NEGATIVE
SPEC GRAV UA: 1.015
UROBILINOGEN UA: 0.2

## 2014-06-21 LAB — POCT UA - MICROSCOPIC ONLY
CASTS, UR, LPF, POC: NEGATIVE
CRYSTALS, UR, HPF, POC: NEGATIVE
TRICHOMONAS UA: POSITIVE
Yeast, UA: NEGATIVE

## 2014-06-21 LAB — POCT CBC
GRANULOCYTE PERCENT: 54.1 % (ref 37–80)
HCT, POC: 38.7 % (ref 37.7–47.9)
HEMOGLOBIN: 12.3 g/dL (ref 12.2–16.2)
Lymph, poc: 3 (ref 0.6–3.4)
MCH, POC: 28.9 pg (ref 27–31.2)
MCHC: 31.7 g/dL — AB (ref 31.8–35.4)
MCV: 91.3 fL (ref 80–97)
MID (CBC): 0.5 (ref 0–0.9)
MPV: 7.5 fL (ref 0–99.8)
PLATELET COUNT, POC: 290 10*3/uL (ref 142–424)
POC GRANULOCYTE: 4.1 (ref 2–6.9)
POC LYMPH PERCENT: 39.7 %L (ref 10–50)
POC MID %: 6.2 % (ref 0–12)
RBC: 4.24 M/uL (ref 4.04–5.48)
RDW, POC: 19.3 %
WBC: 7.6 10*3/uL (ref 4.6–10.2)

## 2014-06-21 LAB — COMPREHENSIVE METABOLIC PANEL
ALK PHOS: 48 U/L (ref 39–117)
ALT: 10 U/L (ref 0–35)
AST: 17 U/L (ref 0–37)
Albumin: 4.2 g/dL (ref 3.5–5.2)
BILIRUBIN TOTAL: 0.3 mg/dL (ref 0.2–1.2)
BUN: 11 mg/dL (ref 6–23)
CO2: 26 meq/L (ref 19–32)
CREATININE: 0.87 mg/dL (ref 0.50–1.10)
Calcium: 9.4 mg/dL (ref 8.4–10.5)
Chloride: 103 mEq/L (ref 96–112)
GLUCOSE: 79 mg/dL (ref 70–99)
Potassium: 4.1 mEq/L (ref 3.5–5.3)
SODIUM: 133 meq/L — AB (ref 135–145)
TOTAL PROTEIN: 7.5 g/dL (ref 6.0–8.3)

## 2014-06-21 LAB — POCT WET PREP WITH KOH
CLUE CELLS WET PREP PER HPF POC: NEGATIVE
KOH Prep POC: NEGATIVE
TRICHOMONAS UA: POSITIVE
YEAST WET PREP PER HPF POC: NEGATIVE

## 2014-06-21 LAB — POCT SEDIMENTATION RATE: POCT SED RATE: 50 mm/h — AB (ref 0–22)

## 2014-06-21 LAB — C-REACTIVE PROTEIN

## 2014-06-21 LAB — CK: Total CK: 45 U/L (ref 7–177)

## 2014-06-21 LAB — GLUCOSE, POCT (MANUAL RESULT ENTRY): POC GLUCOSE: 88 mg/dL (ref 70–99)

## 2014-06-21 MED ORDER — CIPROFLOXACIN HCL 500 MG PO TABS: 500.0000 mg | ORAL_TABLET | Freq: Two times a day (BID) | ORAL | Status: DC

## 2014-06-21 MED ORDER — FLUCONAZOLE 150 MG PO TABS: 150.0000 mg | ORAL_TABLET | Freq: Once | ORAL | Status: DC

## 2014-06-21 MED ORDER — METRONIDAZOLE 500 MG PO TABS: 2000.0000 mg | ORAL_TABLET | Freq: Once | ORAL | Status: DC
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
## 2014-06-21 NOTE — Patient Instructions (Signed)
Trichomoniasis °Trichomoniasis is an infection caused by an organism called Trichomonas. The infection can affect both women and men. In women, the outer female genitalia and the vagina are affected. In men, the penis is mainly affected, but the prostate and other reproductive organs can also be involved. Trichomoniasis is a sexually transmitted infection (STI) and is most often passed to another person through sexual contact.  °RISK FACTORS °· Having unprotected sexual intercourse. °· Having sexual intercourse with an infected partner. °SIGNS AND SYMPTOMS  °Symptoms of trichomoniasis in women include: °· Abnormal gray-green frothy vaginal discharge. °· Itching and irritation of the vagina. °· Itching and irritation of the area outside the vagina. °Symptoms of trichomoniasis in men include:  °· Penile discharge with or without pain. °· Pain during urination. This results from inflammation of the urethra. °DIAGNOSIS  °Trichomoniasis may be found during a Pap test or physical exam. Your health care provider may use one of the following methods to help diagnose this infection: °· Examining vaginal discharge under a microscope. For men, urethral discharge would be examined. °· Testing the pH of the vagina with a test tape. °· Using a vaginal swab test that checks for the Trichomonas organism. A test is available that provides results within a few minutes. °· Doing a culture test for the organism. This is not usually needed. °TREATMENT  °· You may be given medicine to fight the infection. Women should inform their health care provider if they could be or are pregnant. Some medicines used to treat the infection should not be taken during pregnancy. °· Your health care provider may recommend over-the-counter medicines or creams to decrease itching or irritation. °· Your sexual partner will need to be treated if infected. °HOME CARE INSTRUCTIONS  °· Take medicines only as directed by your health care provider. °· Take  over-the-counter medicine for itching or irritation as directed by your health care provider. °· Do not have sexual intercourse while you have the infection. °· Women should not douche or wear tampons while they have the infection. °· Discuss your infection with your partner. Your partner may have gotten the infection from you, or you may have gotten it from your partner. °· Have your sex partner get examined and treated if necessary. °· Practice safe, informed, and protected sex. °· See your health care provider for other STI testing. °SEEK MEDICAL CARE IF:  °· You still have symptoms after you finish your medicine. °· You develop abdominal pain. °· You have pain when you urinate. °· You have bleeding after sexual intercourse. °· You develop a rash. °· Your medicine makes you sick or makes you throw up (vomit). °MAKE SURE YOU: °· Understand these instructions. °· Will watch your condition. °· Will get help right away if you are not doing well or get worse. °Document Released: 03/24/2001 Document Revised: 02/12/2014 Document Reviewed: 07/10/2013 °ExitCare® Patient Information ©2015 [employer]. This information is not intended to replace advice given to you by your health care provider. Make sure you discuss any questions you have with your health care provider. ° °Urinary Tract Infection °Urinary tract infections (UTIs) can develop anywhere along your urinary tract. Your urinary tract is your body's drainage system for removing wastes and extra water. Your urinary tract includes two kidneys, two ureters, a bladder, and a urethra. Your kidneys are a pair of bean-shaped organs. Each kidney is about the size of your fist. They are located below your ribs, one on each side of your spine. °CAUSES °Infections   are caused by microbes, which are microscopic organisms, including fungi, viruses, and bacteria. These organisms are so small that they can only be seen through a microscope. Bacteria are the microbes that most  commonly cause UTIs. °SYMPTOMS  °Symptoms of UTIs may vary by age and gender of the patient and by the location of the infection. Symptoms in young women typically include a frequent and intense urge to urinate and a painful, burning feeling in the bladder or urethra during urination. Older women and men are more likely to be tired, shaky, and weak and have muscle aches and abdominal pain. A fever may mean the infection is in your kidneys. Other symptoms of a kidney infection include pain in your back or sides below the ribs, nausea, and vomiting. °DIAGNOSIS °To diagnose a UTI, your caregiver will ask you about your symptoms. Your caregiver also will ask to provide a urine sample. The urine sample will be tested for bacteria and white blood cells. White blood cells are made by your body to help fight infection. °TREATMENT  °Typically, UTIs can be treated with medication. Because most UTIs are caused by a bacterial infection, they usually can be treated with the use of antibiotics. The choice of antibiotic and length of treatment depend on your symptoms and the type of bacteria causing your infection. °HOME CARE INSTRUCTIONS °· If you were prescribed antibiotics, take them exactly as your caregiver instructs you. Finish the medication even if you feel better after you have only taken some of the medication. °· Drink enough water and fluids to keep your urine clear or pale yellow. °· Avoid caffeine, tea, and carbonated beverages. They tend to irritate your bladder. °· Empty your bladder often. Avoid holding urine for long periods of time. °· Empty your bladder before and after sexual intercourse. °· After a bowel movement, women should cleanse from front to back. Use each tissue only once. °SEEK MEDICAL CARE IF:  °· You have back pain. °· You develop a fever. °· Your symptoms do not begin to resolve within 3 days. °SEEK IMMEDIATE MEDICAL CARE IF:  °· You have severe back pain or lower abdominal pain. °· You develop  chills. °· You have nausea or vomiting. °· You have continued burning or discomfort with urination. °MAKE SURE YOU:  °· Understand these instructions. °· Will watch your condition. °· Will get help right away if you are not doing well or get worse. °Document Released: 07/08/2005 Document Revised: 03/29/2012 Document Reviewed: 11/06/2011 °ExitCare® Patient Information ©2015 [employer]. This information is not intended to replace advice given to you by your health care provider. Make sure you discuss any questions you have with your health care provider. ° °

## 2014-06-22 LAB — RPR

## 2014-06-22 LAB — FERRITIN: FERRITIN: 13 ng/mL (ref 10–291)

## 2014-06-22 LAB — HEPATITIS C ANTIBODY: HCV AB: NEGATIVE

## 2014-06-22 LAB — GC/CHLAMYDIA PROBE AMP
CT Probe RNA: NEGATIVE
GC Probe RNA: NEGATIVE

## 2014-06-22 LAB — HIV ANTIBODY (ROUTINE TESTING W REFLEX): HIV: NONREACTIVE

## 2014-06-22 LAB — VITAMIN B12: VITAMIN B 12: 667 pg/mL (ref 211–911)

## 2014-06-22 LAB — VITAMIN D 25 HYDROXY (VIT D DEFICIENCY, FRACTURES): VIT D 25 HYDROXY: 31 ng/mL (ref 30–89)

## 2014-06-22 LAB — TSH: TSH: 0.686 u[IU]/mL (ref 0.350–4.500)

## 2014-06-24 LAB — URINE CULTURE: Colony Count: >=100000

## 2014-06-26 ENCOUNTER — Encounter: Payer: Self-pay | Admitting: Family Medicine

## 2014-07-03 NOTE — Progress Notes (Signed)
Left a message for patient to return call to schedule a follow up appointment with Dr. Brigitte Pulse in the next week or two.

## 2014-07-10 NOTE — Progress Notes (Signed)
Unable to leave message at this time due to mail box being full.  Will try again later.

## 2014-07-12 NOTE — H&P (Signed)
 [facility]  History and Physical   Assessment  Multiple Myomas Diffuse myomatous appearance of uterus Menorrhagia Desire to preserve reproductive potential       Plan  L/S laparotomy myomectomy  History  Sabrina [name] is a 38 y.o. female who presents with menorrhagia and multiple uterine im myomas.   History of Present Illness She wishes to preserve reproductive potential   OB History    No data available     Past Medical History  Diagnosis Date  . Teeth missing    Past Surgical History  Procedure Laterality Date  . Cyst removal      ANAL CAVITY  . Colonoscopy      No Known Allergies No current facility-administered medications on file prior to encounter.   No current outpatient prescriptions on file prior to encounter.   No family history on file.  Review of Systems - All systems reviewed and are negative except diffuse myomatous appearance of uterus.   Physical Examination  There were no vitals taken for this visit.   Physical Exam  Constitutional: She is oriented to person, place, and time. She appears well-developed.  HENT:  Head: Normocephalic.  Eyes: Conjunctivae are normal. Pupils are equal, round, and reactive to light.  Neck: Neck supple. No thyromegaly present.  Cardiovascular: Normal rate.   Pulmonary/Chest: Effort normal and breath sounds normal.  Abdominal: Soft. Bowel sounds are normal. She exhibits mass (Pelvic mass rising to 1/2 btw symphysis and umbilicus). There is no tenderness.    Genitourinary: Vagina normal.  Uterus 14 wk size firm n/t, irregular shaped  Musculoskeletal: She exhibits no edema and no tenderness.  Neurological: She is alert and oriented to person, place, and time.  Skin: Skin is warm.  Psychiatric: She has a normal mood and affect. Her behavior is normal. Judgment and thought content normal.   Electronically Signed by: Mehmet T Yalcinkaya

## 2014-07-20 NOTE — Progress Notes (Signed)
Spoke with patient and she stated that she just had surgery to remove fibroid tumors.  Do you still want to schedule a follow up appointment with her?

## 2014-09-30 ENCOUNTER — Ambulatory Visit (INDEPENDENT_AMBULATORY_CARE_PROVIDER_SITE_OTHER): Payer: No Typology Code available for payment source | Admitting: Family Medicine

## 2014-09-30 VITALS — BP 114/68 | HR 73 | Temp 98.4°F | Resp 18 | Ht 64.0 in | Wt 140.6 lb

## 2014-09-30 DIAGNOSIS — Z716 Tobacco abuse counseling: Secondary | ICD-10-CM

## 2014-09-30 DIAGNOSIS — Z720 Tobacco use: Secondary | ICD-10-CM

## 2014-09-30 MED ORDER — VARENICLINE TARTRATE 0.5 MG PO TABS: 0.5000 mg | ORAL_TABLET | Freq: Every day | ORAL | Status: DC

## 2014-09-30 MED ORDER — VARENICLINE TARTRATE 1 MG PO TABS
1.0000 mg | ORAL_TABLET | Freq: Two times a day (BID) | ORAL | Status: DC
Start: 2014-09-30 — End: 2016-04-29

## 2014-09-30 NOTE — Progress Notes (Signed)
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 She's been smoking since she was 38 years old. She had a period where she quit for an extended period (3 years) sees but then over the last 10 days to start smoking again. She generally smokes just at night after work.  In the past patient has tried patches, gum, Wellbutrin without success. She's coming in today because she like to try the Chantix.  We discussed the various methods of smoke cessation, picking a day. Patient is definitely ready to quit smoking once and for all.  Patient denies any respiratory problems at this time. She has had a history of some depression in the past and we reviewed the fact that she may have bad dreams or depression with the Chantix. In addition she may develop some nausea with it.  During the time she had a depression she took Wellbutrin which did not help her but Effexor was very effective at stopping her depression. An off Effexor for several years now.  For these reasons I started her on 0.5 mg Chantix once a day to be increased to 1 mg once a day and eventually 1 mg twice a day as needed controlled smoking urge.  Robyn Haber, MD

## 2014-09-30 NOTE — Progress Notes (Signed)
[facility] [address], [place] [address] [phone]  Date:  09/30/2014   Name:  Sabrina [name]   DOB:  Jul 04, 1976   MRN:  [id number]

## 2014-09-30 NOTE — Patient Instructions (Signed)
Smoking Cessation Quitting smoking is important to your health and has many advantages. However, it is not always easy to quit since nicotine is a very addictive drug. Oftentimes, people try 3 times or more before being able to quit. This document explains the best ways for you to prepare to quit smoking. Quitting takes hard work and a lot of effort, but you can do it. ADVANTAGES OF QUITTING SMOKING  You will live longer, feel better, and live better.  Your body will feel the impact of quitting smoking almost immediately.  Within 20 minutes, blood pressure decreases. Your pulse returns to its normal level.  After 8 hours, carbon monoxide levels in the blood return to normal. Your oxygen level increases.  After 24 hours, the chance of having a heart attack starts to decrease. Your breath, hair, and body stop smelling like smoke.  After 48 hours, damaged nerve endings begin to recover. Your sense of taste and smell improve.  After 72 hours, the body is virtually free of nicotine. Your bronchial tubes relax and breathing becomes easier.  After 2 to 12 weeks, lungs can hold more air. Exercise becomes easier and circulation improves.  The risk of having a heart attack, stroke, cancer, or lung disease is greatly reduced.  After 1 year, the risk of coronary heart disease is cut in half.  After 5 years, the risk of stroke falls to the same as a nonsmoker.  After 10 years, the risk of lung cancer is cut in half and the risk of other cancers decreases significantly.  After 15 years, the risk of coronary heart disease drops, usually to the level of a nonsmoker.  If you are pregnant, quitting smoking will improve your chances of having a healthy baby.  The people you live with, especially any children, will be healthier.  You will have extra money to spend on things other than cigarettes. QUESTIONS TO THINK ABOUT BEFORE ATTEMPTING TO QUIT You may want to talk about your answers with your  health care provider.  Why do you want to quit?  If you tried to quit in the past, what helped and what did not?  What will be the most difficult situations for you after you quit? How will you plan to handle them?  Who can help you through the tough times? Your family? Friends? A health care provider?  What pleasures do you get from smoking? What ways can you still get pleasure if you quit? Here are some questions to ask your health care provider:  How can you help me to be successful at quitting?  What medicine do you think would be best for me and how should I take it?  What should I do if I need more help?  What is smoking withdrawal like? How can I get information on withdrawal? GET READY  Set a quit date.  Change your environment by getting rid of all cigarettes, ashtrays, matches, and lighters in your home, car, or work. Do not let people smoke in your home.  Review your past attempts to quit. Think about what worked and what did not. GET SUPPORT AND ENCOURAGEMENT You have a better chance of being successful if you have help. You can get support in many ways.  Tell your family, friends, and coworkers that you are going to quit and need their support. Ask them not to smoke around you.  Get individual, group, or telephone counseling and support. Programs are available at local hospitals and health centers. Call   your local health department for information about programs in your area.  Spiritual beliefs and practices may help some smokers quit.  Download a "quit meter" on your computer to keep track of quit statistics, such as how long you have gone without smoking, cigarettes not smoked, and money saved.  Get a self-help book about quitting smoking and staying off tobacco. LEARN NEW SKILLS AND BEHAVIORS  Distract yourself from urges to smoke. Talk to someone, go for a walk, or occupy your time with a task.  Change your normal routine. Take a different route to work.  Drink tea instead of coffee. Eat breakfast in a different place.  Reduce your stress. Take a hot bath, exercise, or read a book.  Plan something enjoyable to do every day. Reward yourself for not smoking.  Explore interactive web-based programs that specialize in helping you quit. GET MEDICINE AND USE IT CORRECTLY Medicines can help you stop smoking and decrease the urge to smoke. Combining medicine with the above behavioral methods and support can greatly increase your chances of successfully quitting smoking.  Nicotine replacement therapy helps deliver nicotine to your body without the negative effects and risks of smoking. Nicotine replacement therapy includes nicotine gum, lozenges, inhalers, nasal sprays, and skin patches. Some may be available over-the-counter and others require a prescription.  Antidepressant medicine helps people abstain from smoking, but how this works is unknown. This medicine is available by prescription.  Nicotinic receptor partial agonist medicine simulates the effect of nicotine in your brain. This medicine is available by prescription. Ask your health care provider for advice about which medicines to use and how to use them based on your health history. Your health care provider will tell you what side effects to look out for if you choose to be on a medicine or therapy. Carefully read the information on the package. Do not use any other product containing nicotine while using a nicotine replacement product.  RELAPSE OR DIFFICULT SITUATIONS Most relapses occur within the first 3 months after quitting. Do not be discouraged if you start smoking again. Remember, most people try several times before finally quitting. You may have symptoms of withdrawal because your body is used to nicotine. You may crave cigarettes, be irritable, feel very hungry, cough often, get headaches, or have difficulty concentrating. The withdrawal symptoms are only temporary. They are strongest  when you first quit, but they will go away within 10-14 days. To reduce the chances of relapse, try to:  Avoid drinking alcohol. Drinking lowers your chances of successfully quitting.  Reduce the amount of caffeine you consume. Once you quit smoking, the amount of caffeine in your body increases and can give you symptoms, such as a rapid heartbeat, sweating, and anxiety.  Avoid smokers because they can make you want to smoke.  Do not let weight gain distract you. Many smokers will gain weight when they quit, usually less than 10 pounds. Eat a healthy diet and stay active. You can always lose the weight gained after you quit.  Find ways to improve your mood other than smoking. FOR MORE INFORMATION  www.smokefree.gov  Document Released: 09/22/2001 Document Revised: 02/12/2014 Document Reviewed: 01/07/2012 ExitCare Patient Information 2015 [employer]. This information is not intended to replace advice given to you by your health care provider. Make sure you discuss any questions you have with your health care provider.  

## 2014-10-08 ENCOUNTER — Ambulatory Visit (INDEPENDENT_AMBULATORY_CARE_PROVIDER_SITE_OTHER): Payer: No Typology Code available for payment source | Admitting: Family Medicine

## 2014-10-08 VITALS — BP 100/60 | HR 70 | Temp 98.1°F | Resp 16 | Ht 64.0 in | Wt 145.0 lb

## 2014-10-08 DIAGNOSIS — F411 Generalized anxiety disorder: Secondary | ICD-10-CM

## 2014-10-08 DIAGNOSIS — R0789 Other chest pain: Secondary | ICD-10-CM

## 2014-10-08 DIAGNOSIS — R079 Chest pain, unspecified: Secondary | ICD-10-CM

## 2014-10-08 NOTE — Patient Instructions (Addendum)
Take ibuprofen 600 mg 3 times daily (3200) for about 5 days.  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 If she has more problems with chest pains we will need to give her a medical excuse for about 3 days while she tries to calm this down.  In the event of unpredicted severe prolonged pain go to the emergency room.

## 2014-10-08 NOTE — Progress Notes (Signed)
Subjective: Patient had 3 episodes of a grabbing chest wall pain in her her left substernal area. She says it was from her heart. It was a clawing like pain that lasted for about a minute and then about 50 seconds later she had the next episode with same thing and a third episode at same interval. No nausea or vomiting no diaphoresis or shortness of breath if she stretched her arm up it seemed to give some relief. 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 She also a few days ago had a massage herself, which went a little too hard on her chest wall. She does admit to having some problems with anxiety. No major stresses in her life. Her menstrual cycle started today. Does use contraception.  Objective: Healthy-appearing young lady. Throat clear. Neck supple without nodes or thyromegaly. Chest clear to auscultation. Heart regular without murmurs gallops arrhythmias. Abdomen soft without mass or tenderness.  EKG has some nonspecific changes that are similar to her old EKG of almost 3 years ago.  Assessment: Atypical chest pains Musculoskeletal chest wall pain  Plan: Make sure that the motion she uses in the massage symmetrically stresses her chest wall and she is leaning into things. If she has more problems with chest pains we will need to give her a medical excuse for about 3 days while she tries to calm this down.  In the event of unprotected severe prolonged pain go to the emergency room.

## 2014-11-13 ENCOUNTER — Ambulatory Visit (INDEPENDENT_AMBULATORY_CARE_PROVIDER_SITE_OTHER): Payer: No Typology Code available for payment source

## 2014-11-13 ENCOUNTER — Ambulatory Visit (INDEPENDENT_AMBULATORY_CARE_PROVIDER_SITE_OTHER): Payer: No Typology Code available for payment source | Admitting: Urgent Care

## 2014-11-13 VITALS — BP 112/64 | HR 70 | Temp 97.8°F | Resp 16 | Ht 64.5 in | Wt 143.0 lb

## 2014-11-13 DIAGNOSIS — M25532 Pain in left wrist: Secondary | ICD-10-CM

## 2014-11-13 DIAGNOSIS — M7722 Periarthritis, left wrist: Secondary | ICD-10-CM

## 2014-11-13 DIAGNOSIS — S6992XA Unspecified injury of left wrist, hand and finger(s), initial encounter: Secondary | ICD-10-CM

## 2014-11-13 LAB — POCT URINE PREGNANCY: PREG TEST UR: NEGATIVE

## 2014-11-13 NOTE — Progress Notes (Signed)
    MRN: [id number] DOB: 1976-05-21  Subjective:   Sabrina [name] is a 39 y.o. female presenting for 1 day history left wrist injury at home. Patient was folding linens and accidentally swung hand over door handle, made direct impact with her ulnar left wrist. She reports immediate severe pain, tingling in her hand, wrist became swollen that night. She soaked hand in warm water, tried icing and rested wrist with improvement in tingling. Her pain has persisted, aggravated by movement and touch, hear popping noises. She has not taken anything for pain. Of note, she is a massage therapist, is left-handed, has previously had problems with overuse resolved within 1 day by soaking in warm water. Denies any other aggravating or relieving factors, no other questions or concerns.  Sabrina [name] has a current medication list which includes the following prescription(s): varenicline and varenicline.  She has No Known Allergies.  Sabrina [name]  has a past medical history of Anxiety; Depression; Anemia; and Fibroids. Also  has past surgical history that includes Rectal examination under anesthesia w/ cystoscopy and Myomectomy.  ROS As in subjective.  Objective:   Vitals: BP 112/64 mmHg  Pulse 70  Temp(Src) 97.8 F (36.6 C) (Oral)  Resp 16  Ht 5' 4.5" (1.638 m)  Wt 143 lb (64.864 kg)  BMI 24.18 kg/m2  SpO2 100%  LMP 10/06/2014  Physical Exam  Constitutional: She is oriented to person, place, and time and well-developed, well-nourished, and in no distress.  Cardiovascular: Normal rate.   Radial pulse 2+ bilaterally.  Pulmonary/Chest: Effort normal.  Musculoskeletal:       Left wrist: She exhibits decreased range of motion, bony tenderness (exquisitive tenderness over ulnar styloid), swelling (mostly throughout left wrist up to distal forearm and proximal hand) and crepitus (over ulnar styloid). She exhibits no deformity.  Neurological: She is alert and oriented to person, place, and time.  Left upper  extremity with sensation intact.  Skin: Skin is warm and dry. No rash noted. She is not diaphoretic. No erythema. No pallor.  Psychiatric: Mood and affect normal.   Results for orders placed or performed in visit on 11/13/14 (from the past 24 hour(s))  POCT urine pregnancy     Status: Normal   Collection Time: 11/13/14  3:37 PM  Result Value Ref Range   Preg Test, Ur Negative    [facility] reading (PRIMARY) by  Dr. Ouida Sills and PA-Kurk Corniel. Left wrist x-ray: No evidence of fracture or dislocation.  Assessment and Plan :   1. Wrist pain, left 2. Inflammation around wrist, left 3. Wrist injury, left, initial encounter - X-ray reassuring, over-read pending. - Advised RICE, NSAID for pain, inflammation - Consider PT, ortho referral if no resolution of symptoms in 1-2 weeks - UPT done to r/o pregnancy prior to x-ray  Jaynee Eagles, PA-C [facility] [phone] 11/13/2014 3:42 PM

## 2014-11-13 NOTE — Progress Notes (Signed)
  Medical screening examination/treatment/procedure(s) were performed by non-physician practitioner and as supervising physician I was immediately available for consultation/collaboration.     

## 2014-11-13 NOTE — Patient Instructions (Addendum)
You may use ibuprofen, naproxen over the counter for pain, inflammation and swelling. Please take this with food.   RICE: Routine Care for Injuries The routine care of many injuries includes Rest, Ice, Compression, and Elevation (RICE). HOME CARE INSTRUCTIONS  Rest is needed to allow your body to heal. Routine activities can usually be resumed when comfortable. Injured tendons and bones can take up to 6 weeks to heal. Tendons are the cord-like structures that attach muscle to bone.  Ice following an injury helps keep the swelling down and reduces pain.  Put ice in a plastic bag.  Place a towel between your skin and the bag.  Leave the ice on for 15-20 minutes, 3-4 times a day, or as directed by your health care provider. Do this while awake, for the first 24 to 48 hours. After that, continue as directed by your caregiver.  Compression helps keep swelling down. It also gives support and helps with discomfort. If an elastic bandage has been applied, it should be removed and reapplied every 3 to 4 hours. It should not be applied tightly, but firmly enough to keep swelling down. Watch fingers or toes for swelling, bluish discoloration, coldness, numbness, or excessive pain. If any of these problems occur, remove the bandage and reapply loosely. Contact your caregiver if these problems continue.  Elevation helps reduce swelling and decreases pain. With extremities, such as the arms, hands, legs, and feet, the injured area should be placed near or above the level of the heart, if possible. SEEK IMMEDIATE MEDICAL CARE IF:  You have persistent pain and swelling.  You develop redness, numbness, or unexpected weakness.  Your symptoms are getting worse rather than improving after several days. These symptoms may indicate that further evaluation or further X-rays are needed. Sometimes, X-rays may not show a small broken bone (fracture) until 1 week or 10 days later. Make a follow-up appointment with  your caregiver. Ask when your X-ray results will be ready. Make sure you get your X-ray results. Document Released: 01/10/2001 Document Revised: 10/03/2013 Document Reviewed: 02/27/2011 [facility] Patient Information 2015 [place], [place]. This information is not intended to replace advice given to you by your health care provider. Make sure you discuss any questions you have with your health care provider.

## 2015-10-12 ENCOUNTER — Ambulatory Visit (INDEPENDENT_AMBULATORY_CARE_PROVIDER_SITE_OTHER): Payer: Self-pay | Admitting: Physician Assistant

## 2015-10-12 VITALS — BP 102/64 | HR 63 | Temp 98.2°F | Resp 17 | Ht 65.0 in | Wt 145.0 lb

## 2015-10-12 DIAGNOSIS — N898 Other specified noninflammatory disorders of vagina: Secondary | ICD-10-CM

## 2015-10-12 DIAGNOSIS — N3001 Acute cystitis with hematuria: Secondary | ICD-10-CM

## 2015-10-12 DIAGNOSIS — R300 Dysuria: Secondary | ICD-10-CM

## 2015-10-12 DIAGNOSIS — Z202 Contact with and (suspected) exposure to infections with a predominantly sexual mode of transmission: Secondary | ICD-10-CM

## 2015-10-12 LAB — POCT WET + KOH PREP
TRICH BY WET PREP: ABSENT
Yeast by KOH: ABSENT
Yeast by wet prep: ABSENT

## 2015-10-12 LAB — POCT URINALYSIS DIP (MANUAL ENTRY)
BILIRUBIN UA: NEGATIVE
BILIRUBIN UA: NEGATIVE
GLUCOSE UA: NEGATIVE
Nitrite, UA: POSITIVE — AB
PH UA: 7
Spec Grav, UA: 1.02
Urobilinogen, UA: 0.2

## 2015-10-12 LAB — POC MICROSCOPIC URINALYSIS (UMFC): MUCUS RE: ABSENT

## 2015-10-12 MED ORDER — SULFAMETHOXAZOLE-TRIMETHOPRIM 800-160 MG PO TABS: 1.0000 | ORAL_TABLET | Freq: Two times a day (BID) | ORAL | Status: AC

## 2015-10-12 NOTE — Progress Notes (Signed)
[facility] [address] [place] [address] [phone]  Date:  10/12/2015   Name:  Sabrina [name]   DOB:  [birth year]   MRN:  [id number]  PCP:  Default, Provider, MD   Chief Complaint  Patient presents with  . Urinary Tract Infection     History of Present Illness:  Sabrina [name] is a 39 y.o. female patient who presents to [facility] for cc of abnormal dishcarge for 2 weeks.   2 weeks abnormal discharge, malodorous--musty smell.  Discharge is yellow white.  No pruritus.  No dysuria.   After sexual activity ago, she urinated blood in the urine, but since then she has seen none.   No abdominal pain.  No frequency, fever, or nausea.   2 months ago, sexually active--unprotected.  Hx of trichomoniasis.  Stated that she was supposed to return to see clearance, but did not. Patient's last menstrual period was 09/23/2015.  Patient Active Problem List   Diagnosis Date Noted  . Fibroids 10/13/2013    Past Medical History  Diagnosis Date  . Anxiety   . Depression   . Anemia   . Fibroids     Past Surgical History  Procedure Laterality Date  . Rectal examination under anesthesia w/ cystoscopy    . Myomectomy      Social History  Substance Use Topics  . Smoking status: Former Smoker    Types: Cigarettes    Quit date: 11/06/2014  . Smokeless tobacco: None  . Alcohol Use: No    Family History  Problem Relation Age of Onset  . Mental illness Father   . Hypertension Mother     No Known Allergies  Medication list has been reviewed and updated.  Current Outpatient Prescriptions on File Prior to Visit  Medication Sig Dispense Refill  . varenicline (CHANTIX CONTINUING MONTH PAK) 1 MG tablet Take 1 tablet (1 mg total) by mouth 2 (two) times daily. (Patient not taking: Reported on 10/12/2015) 60 tablet 11  . varenicline (CHANTIX) 0.5 MG tablet Take 1 tablet (0.5 mg total) by mouth daily. (Patient not taking: Reported on 10/12/2015) 7 tablet 1   No  current facility-administered medications on file prior to visit.    ROS ROS otherwise unremarkable unless listed above.   Physical Examination: BP 102/64 mmHg  Pulse 63  Temp(Src) 98.2 F (36.8 C) (Oral)  Resp 17  Ht 5\' 5"  (1.651 m)  Wt 145 lb (65.772 kg)  BMI 24.13 kg/m2  SpO2 98%  LMP 09/23/2015 Ideal Body Weight: Weight in (lb) to have BMI = 25: 149.9  Physical Exam  Constitutional: She is oriented to person, place, and time. She appears well-developed and well-nourished. No distress.  HENT:  Head: Normocephalic and atraumatic.  Right Ear: External ear normal.  Left Ear: External ear normal.  Eyes: Conjunctivae and EOM are normal. Pupils are equal, round, and reactive to light.  Cardiovascular: Normal rate and regular rhythm.   Pulmonary/Chest: Effort normal. No respiratory distress. She has no decreased breath sounds. She has no wheezes. She has no rhonchi.  Abdominal: Soft. Normal appearance. There is no hepatosplenomegaly. There is no tenderness.  Genitourinary: Pelvic exam was performed with patient supine. There is no rash on the right labia. There is no rash on the left labia. Cervix exhibits discharge (frothy). Cervix exhibits no motion tenderness and no friability. No vaginal discharge found.  Neurological: She is alert and oriented to person, place, and time.  Skin: She is not diaphoretic.  Psychiatric: She has a normal mood and affect. Her behavior is normal.   Results for orders placed or performed in visit on 10/12/15  POCT urinalysis dipstick  Result Value Ref Range   Color, UA yellow yellow   Clarity, UA cloudy (A) clear   Glucose, UA negative negative   Bilirubin, UA negative negative   Ketones, POC UA negative negative   Spec Grav, UA 1.020    Blood, UA moderate (A) negative   pH, UA 7.0    Protein Ur, POC trace (A) negative   Urobilinogen, UA 0.2    Nitrite, UA Positive (A) Negative   Leukocytes, UA large (3+) (A) Negative  POCT Microscopic  Urinalysis ([facility])  Result Value Ref Range   WBC,UR,HPF,POC Many (A) None WBC/hpf   RBC,UR,HPF,POC Moderate (A) None RBC/hpf   Bacteria Many (A) None, Too numerous to count   Mucus Absent Absent   Epithelial Cells, UR Per Microscopy Few (A) None, Too numerous to count cells/hpf  POCT Wet + KOH Prep  Result Value Ref Range   Yeast by KOH Absent Present, Absent   Yeast by wet prep Absent Present, Absent   WBC by wet prep None None, Few, Too numerous to count   Clue Cells Wet Prep HPF POC Few (A) None, Too numerous to count   Trich by wet prep Absent Present, Absent   Bacteria Wet Prep HPF POC Moderate (A) None, Few, Too numerous to count   Epithelial Cells By [employer] ([facility]) Few None, Few, Too numerous to count   RBC,UR,HPF,POC None None RBC/hpf       Assessment and Plan: Sabrina [name] is a 39 y.o. female who is here today for abnormal vaginal discharge. -patient states that she is in a hurry for work. -Advised patient of lab work.  She voiced understanding over the phone the following day.    Acute cystitis with hematuria - Plan: Urine culture, sulfamethoxazole-trimethoprim (BACTRIM DS,SEPTRA DS) 800-160 MG tablet, Trichomonas vaginalis, RNA  Dysuria - Plan: POCT urinalysis dipstick, POCT Microscopic Urinalysis ([facility]), POCT Wet + KOH Prep, GC/Chlamydia Probe Amp, RPR, HIV antibody  Possible exposure to STD - Plan: POCT Wet + KOH Prep, GC/Chlamydia Probe Amp, RPR, HIV antibody  Vaginal discharge - Plan: Trichomonas vaginalis, RNA   Ivar Drape, PA-C [facility] 10/12/2015 1:48 PM  Addendum 10/17/15: was able to discuss directly positive trichomonas rna.  Given metronidazole 2g once.  Advised to return for labs only follow up t rna.  Patient understood and will attempt when insurance becomes available.  She had started her bactrim 1/4 she reports.

## 2015-10-13 ENCOUNTER — Encounter: Payer: Self-pay | Admitting: Physician Assistant

## 2015-10-13 LAB — GC/CHLAMYDIA PROBE AMP
CT Probe RNA: NOT DETECTED
GC Probe RNA: NOT DETECTED

## 2015-10-14 ENCOUNTER — Telehealth: Payer: Self-pay

## 2015-10-14 NOTE — Telephone Encounter (Signed)
Pt requesting call back from [facility] they Spoke about a matter and Colletta [place] was to call her back???   Best phone [phone]

## 2015-10-14 NOTE — Telephone Encounter (Signed)
Pt called back to say she wants a call back from [place] in regard to her labs   Best phone for pt is [phone]

## 2015-10-14 NOTE — Telephone Encounter (Signed)
Sabrina [name]   Please call patient regarding diagnosis - not sure on lab results - has questions    [phone]

## 2015-10-14 NOTE — Telephone Encounter (Signed)
Contacted patient on phone. I had spoke to her, alerting her that this was a uti, and she was placed on bactrim.   Today, i informed her that gonorrhea chlamydia is negative.   Still awaiting lab results for trichomonas.   Advised to contact and ask direct question, as I have gotten her voicemail twice.

## 2015-10-15 ENCOUNTER — Telehealth: Payer: Self-pay

## 2015-10-15 LAB — URINE CULTURE: Colony Count: >=100000

## 2015-10-15 NOTE — Telephone Encounter (Signed)
i have attempted to contact her for the third time Left message detailing --+Urinary tract infection.  We had spoken days ago, and I alerted her that the bactrim was sent to her pharmacy at twice per day for the next 7 days.  She should already be taking this. --gonorrhea and chlamydia were negative. --wet prep showed no trichomonas, we are awaiting the trichomonas vaginalis.  If she calls back she need to leave a time that is best to call her.  Otherwise she needs to return.

## 2015-10-15 NOTE — Telephone Encounter (Signed)
Pt's phone was on do not disturb so she could not hear it ring.  She has one more question for you.

## 2015-10-15 NOTE — Telephone Encounter (Signed)
Pt states she was given some medicine but would like to ask questions about it before she pick it up. Please call [phone]

## 2015-10-16 NOTE — Telephone Encounter (Signed)
Stephanie  Patient would like you to call her between 12:30 and one today.  She will take her phone off of DO NOT DISTURB.  Advised patient you would be back in the office on Friday.  [phone]

## 2015-10-17 ENCOUNTER — Other Ambulatory Visit: Payer: Self-pay | Admitting: Physician Assistant

## 2015-10-17 DIAGNOSIS — A5901 Trichomonal vulvovaginitis: Secondary | ICD-10-CM

## 2015-10-17 LAB — TRICHOMONAS VAGINALIS, PROBE AMP: TRICHOMONAS VAGINALIS PROBE APTIMA: POSITIVE — AB

## 2015-10-17 MED ORDER — METRONIDAZOLE 500 MG PO TABS: 2000.0000 mg | ORAL_TABLET | Freq: Once | ORAL | Status: DC

## 2015-10-17 NOTE — Progress Notes (Signed)
Spoke with patient and advised that she had trichomoniasis found on rna.  Given 2g metronidazole to take once.   She is taking the bactrim twice per day starting yesterday. She voiced understanding. Advised to return for follow up test of cure between 2 weeks and 3 months.  She voiced understanding.

## 2016-04-29 ENCOUNTER — Ambulatory Visit (HOSPITAL_COMMUNITY)
Admission: EM | Admit: 2016-04-29 | Discharge: 2016-04-29 | Disposition: A | Discharge status: Home / Self Care | Payer: Self-pay | Attending: Emergency Medicine | Admitting: Emergency Medicine

## 2016-04-29 ENCOUNTER — Encounter (HOSPITAL_COMMUNITY): Payer: Self-pay | Admitting: Emergency Medicine

## 2016-04-29 DIAGNOSIS — R21 Rash and other nonspecific skin eruption: Secondary | ICD-10-CM

## 2016-04-29 NOTE — ED Provider Notes (Signed)
CSN: [id number]     Arrival date & time 04/29/16  82 History   First MD Initiated Contact with Patient 04/29/16 1616     Chief Complaint  Patient presents with  . Rash   (Consider location/radiation/quality/duration/timing/severity/associated sxs/prior Treatment) HPI  She is a 40 year old woman here for evaluation of rash. She states she noticed some itchy bumps on her arms and shoulders on Monday. She denies any new lotions or detergents or other products. 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 Over the last day it has been getting better. She states it is less itchy and less red now than it was yesterday. She has not used anything on the rash.  Past Medical History  Diagnosis Date  . Anxiety   . Depression   . Anemia   . Fibroids    Past Surgical History  Procedure Laterality Date  . Rectal examination under anesthesia w/ cystoscopy    . Myomectomy     Family History  Problem Relation Age of Onset  . Mental illness Father   . Hypertension Mother    Social History  Substance Use Topics  . Smoking status: Former Smoker    Types: Cigarettes    Quit date: 11/06/2014  . Smokeless tobacco: None  . Alcohol Use: No   OB History    No data available     Review of Systems As in history of present illness Allergies  Review of patient's allergies indicates no known allergies.  Home Medications   Prior to Admission medications   Medication Sig Start Date End Date Taking? Authorizing Provider  metroNIDAZOLE (FLAGYL) 500 MG tablet Take 4 tablets (2,000 mg total) by mouth once. 10/17/15   Joretta Bachelor, PA   Meds Ordered and Administered this Visit  Medications - No data to display  BP 115/79 mmHg  Pulse 59  Temp(Src) 98.4 F (36.9 C) (Oral)  Resp 18  Ht 5\' 5"  (1.651 m)  Wt 141 lb (63.957 kg)  BMI 23.46 kg/m2  SpO2 100%  LMP 04/07/2016 (Exact Date) No data found.   Physical Exam  Constitutional: She is  oriented to person, place, and time. She appears well-developed and well-nourished. No distress.  Cardiovascular: Normal rate.   Pulmonary/Chest: Effort normal.  Neurological: She is alert and oriented to person, place, and time.  Skin: Rash (she has scattered fine papules on her bilateral arms and shoulders. There is minimal erythema.) noted.    ED Course  Procedures (including critical care time)  Labs Review Labs Reviewed - No data to display  Imaging Review No results found.   MDM   1. Rash    This appears to be a contact irritant or reaction of some kind. No sign of scabies or bedbugs. Recommended OTC hydrocortisone cream as needed for itching. Expect this to continue to resolve over the next 2-3 days.    Melony Overly, MD 04/29/16 [phone]

## 2016-04-29 NOTE — Discharge Instructions (Signed)
It looks like something irritated your skin.  This could've been something you came into contact with at work or from insect bites. I do not see any sign of scabies or bedbugs. You can use over-the-counter hydrocortisone cream twice a day as needed for itching. This should continue to improve over the next several days. Follow-up as needed.

## 2016-04-29 NOTE — ED Notes (Signed)
The patient presented to the [facility] with a complaint of a rash on both arms and shoulders x 3 days.

## 2016-05-02 ENCOUNTER — Ambulatory Visit (HOSPITAL_COMMUNITY)
Admission: EM | Admit: 2016-05-02 | Discharge: 2016-05-02 | Disposition: A | Discharge status: Home / Self Care | Payer: Self-pay | Attending: Family Medicine | Admitting: Family Medicine

## 2016-05-02 ENCOUNTER — Encounter (HOSPITAL_COMMUNITY): Payer: Self-pay | Admitting: Emergency Medicine

## 2016-05-02 DIAGNOSIS — L309 Dermatitis, unspecified: Secondary | ICD-10-CM

## 2016-05-02 NOTE — Discharge Instructions (Signed)
Rash A rash is a change in the color or feel of your skin. There are many different types of rashes. You may have other problems along with your rash. HOME CARE  Avoid the thing that caused your rash.  Do not scratch your rash.  You may take cools baths to help stop itching.  Only take medicines as told by your doctor.  Keep all doctor visits as told. GET HELP RIGHT AWAY IF:   Your pain, puffiness (swelling), or redness gets worse.  You have a fever.  You have new or severe problems.  You have body aches, watery poop (diarrhea), or you throw up (vomit).  Your rash is not better after 3 days. MAKE SURE YOU:   Understand these instructions.  Will watch your condition.  Will get help right away if you are not doing well or get worse.   This information is not intended to replace advice given to you by your health care provider. Make sure you discuss any questions you have with your health care provider.   Document Released: 03/16/2008 Document Revised: 12/21/2011 Document Reviewed: 02/13/2015 Elsevier Interactive Patient Education [employer].

## 2016-05-02 NOTE — ED Notes (Signed)
Patient has a rash .  Seen 7/19 for the same.  Patient reports rash is worsening.  Rash to both arms, shoulders.  Patient reports cortisone is helping the itching.

## 2016-05-02 NOTE — ED Provider Notes (Signed)
CSN: [id number]     Arrival date & time 05/02/16  1157 History   First MD Initiated Contact with Patient 05/02/16 1234     Chief Complaint  Patient presents with  . Rash   (Consider location/radiation/quality/duration/timing/severity/associated sxs/prior Treatment) HPI History obtained from patient:  Pt presents with the cc of:  Rash both arms Duration of symptoms: One week Treatment prior to arrival: Hydrocortisone cream no relief of symptoms at this time Context: Patient states that she was seen in the clinic earlier in the week for rash of both arms. She states that she was given hydrocortisone cream which does not appear to be working she states that she is concerned that the rash is contagious. She was told that it is probably poison oak or sumac. Other symptoms include: Continued itching Pain score: No pain FAMILY HISTORY: Mental illness, hypertension    Past Medical History  Diagnosis Date  . Anxiety   . Depression   . Anemia   . Fibroids    Past Surgical History  Procedure Laterality Date  . Rectal examination under anesthesia w/ cystoscopy    . Myomectomy     Family History  Problem Relation Age of Onset  . Mental illness Father   . Hypertension Mother    Social History  Substance Use Topics  . Smoking status: Former Smoker    Types: Cigarettes    Quit date: 11/06/2014  . Smokeless tobacco: None  . Alcohol Use: No   OB History    No data available     Review of Systems  Denies: HEADACHE, NAUSEA, ABDOMINAL PAIN, CHEST PAIN, CONGESTION, DYSURIA, SHORTNESS OF BREATH   Allergies  Review of patient's allergies indicates no known allergies.  Home Medications   Prior to Admission medications   Medication Sig Start Date End Date Taking? Authorizing Provider  metroNIDAZOLE (FLAGYL) 500 MG tablet Take 4 tablets (2,000 mg total) by mouth once. Patient not taking: Reported on 05/02/2016 10/17/15   Dorian Heckle English, PA   Meds Ordered and Administered this  Visit  Medications - No data to display  BP 107/73 mmHg  Pulse 65  Temp(Src) 98 F (36.7 C) (Oral)  Resp 16  SpO2 99%  LMP 04/07/2016 (Exact Date) No data found.   Physical Exam NURSES NOTES AND VITAL SIGNS REVIEWED. CONSTITUTIONAL: Well developed, well nourished, no acute distress HEENT: normocephalic, atraumatic EYES: Conjunctiva normal NECK:normal ROM, supple, no adenopathy PULMONARY:No respiratory distress, normal effort ABDOMINAL: Soft, ND, NT BS+, No CVAT MUSCULOSKELETAL: Normal ROM of all extremities,  SKIN: warm and dry Both forearms and nonspecific dermatitis lesions noted. Are not open weeping. Papular 1-2 mm in size. PSYCHIATRIC: Mood and affect, behavior are normal   ED Course  Procedures (including critical care time)  Labs Review Labs Reviewed - No data to display  Imaging Review No results found.   Visual Acuity Review  Right Eye Distance:   Left Eye Distance:   Bilateral Distance:    Right Eye Near:   Left Eye Near:    Bilateral Near:    Prescription for prednisone 5 day burst was provided. Return to work note is given to the patient also. All questions have been answered to the best of my ability.  Total Visit Time: 20 MINUTES "GREATER THAN 50% WAS SPENT IN COUNSELING AND COORDINATION OF CARE WITH THE PATIENT" DISCUSSION OF CAUSE OF RASH, CONTAGIOUSNESS OF RASH, AND PROGNOSIS. REASSURANCE THAT PRIOR PHYSICIANS TREATMENT WAS NOT WRONG.   MDM   1. Dermatitis  Patient is reassured that there are no issues that require transfer to higher level of care at this time or additional tests. Patient is advised to continue home symptomatic treatment. Patient is advised that if there are new or worsening symptoms to attend the emergency department, contact primary care provider, or return to UC. Instructions of care provided discharged home in stable condition.    THIS NOTE WAS GENERATED USING A VOICE RECOGNITION SOFTWARE PROGRAM. ALL REASONABLE  EFFORTS  WERE MADE TO PROOFREAD THIS DOCUMENT FOR ACCURACY.  I have verbally reviewed the discharge instructions with the patient. A printed AVS was given to the patient.  All questions were answered prior to discharge.      Konrad Felix, PA 05/02/16 1350

## 2016-08-07 ENCOUNTER — Ambulatory Visit: Payer: Worker's Compensation

## 2016-08-07 ENCOUNTER — Ambulatory Visit (INDEPENDENT_AMBULATORY_CARE_PROVIDER_SITE_OTHER): Payer: No Typology Code available for payment source | Admitting: Family Medicine

## 2016-08-07 DIAGNOSIS — M25562 Pain in left knee: Secondary | ICD-10-CM | POA: Diagnosis not present

## 2016-08-07 MED ORDER — DICLOFENAC SODIUM 1 % TD GEL: 4.0000 g | Freq: Four times a day (QID) | TRANSDERMAL | 4 refills | Status: DC

## 2016-08-07 NOTE — Patient Instructions (Addendum)
Thank you for coming in today. Use the voltaren gel x4 daily for knee pain.  Use ice as needed.  Return if not better after rest.    Patellofemoral Pain Syndrome Patellofemoral pain syndrome is a condition that involves a softening or breakdown of the tissue (cartilage) on the underside of your kneecap (patella). This causes pain in the front of the knee. The condition is also called runner's knee or chondromalacia patella. Patellofemoral pain syndrome is most common in young adults who are active in sports. Your knee is the largest joint in your body. The patella covers the front of your knee and is attached to muscles above and below your knee. The underside of the patella is covered with a smooth type of cartilage (synovium). The smooth surface helps the patella glide easily when you move your knee. Patellofemoral pain syndrome causes swelling in the joint linings and bone surfaces in your knee.  CAUSES  Patellofemoral pain syndrome can be caused by:  Overuse.  Poor alignment of your knee joints.  Weak leg muscles.  A direct blow to your kneecap. RISK FACTORS You may be at risk for patellofemoral pain syndrome if you:  Do a lot of activities that can wear down your kneecap. These include:  Running.  Squatting.  Climbing stairs.  Start a new physical activity or exercise program.  Wear shoes that do not fit well.  Do not have good leg strength.  Are overweight. SIGNS AND SYMPTOMS  Knee pain is the most common symptom of patellofemoral pain syndrome. This may feel like a dull, aching pain underneath your patella, in the front of your knee. There may be a popping or cracking sound when you move your knee. Pain may get worse with:  Exercise.  Climbing stairs.  Running.  Jumping.  Squatting.  Kneeling.  Sitting for a long time.  Moving or pushing on your patella. DIAGNOSIS  Your health care provider may be able to diagnose patellofemoral pain syndrome from your  symptoms and medical history. You may be asked about your recent physical activities and which ones cause knee pain. Your health care provider may do a physical exam with certain tests to confirm the diagnosis. These may include:  Moving your patella back and forth.  Checking your range of knee motion.  Having you squat or jump to see if you have pain.  Checking the strength of your leg muscles. An MRI of the knee may also be done. TREATMENT  Patellofemoral pain syndrome can usually be treated at home with rest, ice, compression, and elevation (RICE). Other treatments may include:  Nonsteroidal anti-inflammatory drugs (NSAIDs).  Physical therapy to stretch and strengthen your leg muscles.  Shoe inserts (orthotics) to take stress off your knee.  A knee brace or knee support.  Surgery to remove damaged cartilage or move the patella to a better position. The need for surgery is rare. HOME CARE INSTRUCTIONS   Take medicines only as directed by your health care provider.  Rest your knee.  When resting, keep your knee raised above the level of your heart.  Avoid activities that cause knee pain.  Apply ice to the injured area:  Put ice in a plastic bag.  Place a towel between your skin and the bag.  Leave the ice on for 20 minutes, 2-3 times a day.  Use splints, braces, knee supports, or walking aids as directed by your health care provider.  Perform stretching and strengthening exercises as directed by your health care  provider or physical therapist.  Keep all follow-up visits as directed by your health care provider. This is important. SEEK MEDICAL CARE IF:   Your symptoms get worse.  You are not improving with home care. MAKE SURE YOU:  Understand these instructions.  Will watch your condition.  Will get help right away if you are not doing well or get worse.   This information is not intended to replace advice given to you by your health care provider. Make sure  you discuss any questions you have with your health care provider.   Document Released: 09/16/2009 Document Revised: 10/19/2014 Document Reviewed: 12/18/2013 Elsevier Interactive Patient Education 2016 [employer].      IF you received an x-ray today, you will receive an invoice from [facility]. Please contact [facility] at [phone] with questions or concerns regarding your invoice.   IF you received labwork today, you will receive an invoice from [employer]. Please contact [facility] at [phone] with questions or concerns regarding your invoice.   Our billing staff will not be able to assist you with questions regarding bills from these companies.  You will be contacted with the lab results as soon as they are available. The fastest way to get your results is to activate your My Chart account. Instructions are located on the last page of this paperwork. If you have not heard from [place] regarding the results in 2 weeks, please contact this office.

## 2016-08-07 NOTE — Progress Notes (Signed)
    Sabrina [name] is a 40 y.o. female who presents to [facility] today for left knee pain. Patient notes a 2 day history of left anterior knee pain. 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 She notes continued anterior knee pain radiating to the anterior shin. No weakness or numbness bowel bladder dysfunction or other injury. She has not tried much treatment. The pain does interfere ability to work and walk.     Past medical surgical history medications and allergies reviewed.  Exam:  BP 114/78   Pulse 63   Temp 98 F (36.7 C) (Oral)   Resp 18   Ht 5\' 5"  (1.651 m)   Wt 150 lb 3.2 oz (68.1 kg)   LMP 07/12/2016   SpO2 99%   BMI 24.99 kg/m  Gen: Well NAD Left knee is normal-appearing without effusion. Tender to palpation overlying the patellar tendon. Normal motion over patient has pain with extension. No crepitations palpated. Stable ligamentous exam. Intact flexion and extension strength.   No results found for this or any previous visit (from the past 24 hour(s)). Dg Knee Complete 4 Views Left  Result Date: 08/07/2016 CLINICAL DATA:  Left anterior knee pain EXAM: LEFT KNEE - COMPLETE 4+ VIEW COMPARISON:  None. FINDINGS: No evidence of fracture, dislocation, or joint effusion. No evidence of arthropathy or other focal bone abnormality. Soft tissues are unremarkable. Incidental note is made of a small bone island in the proximal medial tibia. IMPRESSION: Negative. Electronically Signed   By: Nolon Nations M.D.   On: 08/07/2016 16:11    Assessment and Plan: 40 y.o. female with left anterior knee pain. Likely due to patellofemoral pain versus tendinitis of the patellar tendon. Plan to treat conservatively with a little bit of rest and close the neck gel and ice. Return if not improved.  Discussed warning signs or symptoms. Please see discharge instructions. Patient expresses understanding.

## 2016-08-17 ENCOUNTER — Ambulatory Visit (INDEPENDENT_AMBULATORY_CARE_PROVIDER_SITE_OTHER): Payer: BLUE CROSS/BLUE SHIELD | Admitting: Family Medicine

## 2016-08-17 VITALS — BP 118/68 | HR 63 | Temp 98.5°F | Resp 16 | Ht 65.0 in | Wt 152.0 lb

## 2016-08-17 DIAGNOSIS — M25562 Pain in left knee: Secondary | ICD-10-CM

## 2016-08-17 MED ORDER — DICLOFENAC SODIUM 75 MG PO TBEC: 75.0000 mg | DELAYED_RELEASE_TABLET | Freq: Three times a day (TID) | ORAL | 0 refills | Status: DC

## 2016-08-17 NOTE — Progress Notes (Signed)
Sabrina [name] 11/25/1975 40 y.o.   Chief Complaint  Patient presents with  . Follow-up    left knee, Workers Comp      Date of Injury: 08/07/16  History of Present Illness:  Presents for evaluation of work-related complaint.  Pt reports that she was prescribed diclofenac and has not seen much improvement in her anterior knee pain She reports that her pain is a soreness and would score her pain 6/10 intermittently and provoked by certain movements Aggravated by lunging and bending and squatting. She has also been using ice once to help her inflammation   ROS     Current medications and allergies reviewed and updated. Past medical history, family history, social history have been reviewed and updated.   Physical Exam General: alert and oriented  Left knee exam:  Small effusion noted on the lateral aspect of the knee Tender to palpation along the medial patella  No crepitus Normal range of motion No posterior knee pain  Assessment and Plan:  Sabrina [name] is a 40 y.o. female with left knee pain mostly anterior medial  Likely tendinitis Advised to take diclofenac oral and discontinue topical Will also advise icing with resting and elevation If that does not work in one week would recommend kenalog knee injection

## 2016-08-17 NOTE — Patient Instructions (Addendum)
IF you received an x-ray today, you will receive an invoice from [facility]. Please contact [facility] at [phone] with questions or concerns regarding your invoice.   IF you received labwork today, you will receive an invoice from [employer]. Please contact [facility] at [phone] with questions or concerns regarding your invoice.   Our billing staff will not be able to assist you with questions regarding bills from these companies.  You will be contacted with the lab results as soon as they are available. The fastest way to get your results is to activate your My Chart account. Instructions are located on the last page of this paperwork. If you have not heard from [place] regarding the results in 2 weeks, please contact this office.      Cryotherapy Cryotherapy means treatment with cold. Ice or gel packs can be used to reduce both pain and swelling. Ice is the most helpful within the first 24 to 48 hours after an injury or flare-up from overusing a muscle or joint. Sprains, strains, spasms, burning pain, shooting pain, and aches can all be eased with ice. Ice can also be used when recovering from surgery. Ice is effective, has very few side effects, and is safe for most people to use. PRECAUTIONS  Ice is not a safe treatment option for people with:  Raynaud phenomenon. This is a condition affecting small blood vessels in the extremities. Exposure to cold may cause your problems to return.  Cold hypersensitivity. There are many forms of cold hypersensitivity, including:  Cold urticaria. Red, itchy hives appear on the skin when the tissues begin to warm after being iced.  Cold erythema. This is a red, itchy rash caused by exposure to cold.  Cold hemoglobinuria. Red blood cells break down when the tissues begin to warm after being iced. The hemoglobin that carry oxygen are passed into the urine because they cannot combine with blood  proteins fast enough.  Numbness or altered sensitivity in the area being iced. If you have any of the following conditions, do not use ice until you have discussed cryotherapy with your caregiver:  Heart conditions, such as arrhythmia, angina, or chronic heart disease.  High blood pressure.  Healing wounds or open skin in the area being iced.  Current infections.  Rheumatoid arthritis.  Poor circulation.  Diabetes. Ice slows the blood flow in the region it is applied. This is beneficial when trying to stop inflamed tissues from spreading irritating chemicals to surrounding tissues. However, if you expose your skin to cold temperatures for too long or without the proper protection, you can damage your skin or nerves. Watch for signs of skin damage due to cold. HOME CARE INSTRUCTIONS Follow these tips to use ice and cold packs safely.  Place a dry or damp towel between the ice and skin. A damp towel will cool the skin more quickly, so you may need to shorten the time that the ice is used.  For a more rapid response, add gentle compression to the ice.  Ice for no more than 10 to 20 minutes at a time. The bonier the area you are icing, the less time it will take to get the benefits of ice.  Check your skin after 5 minutes to make sure there are no signs of a poor response to cold or skin damage.  Rest 20 minutes or more between uses.  Once your skin is numb, you can end your treatment. You can test  numbness by very lightly touching your skin. The touch should be so light that you do not see the skin dimple from the pressure of your fingertip. When using ice, most people will feel these normal sensations in this order: cold, burning, aching, and numbness.  Do not use ice on someone who cannot communicate their responses to pain, such as small children or people with dementia. HOW TO MAKE AN ICE PACK Ice packs are the most common way to use ice therapy. Other methods include ice  massage, ice baths, and cryosprays. Muscle creams that cause a cold, tingly feeling do not offer the same benefits that ice offers and should not be used as a substitute unless recommended by your caregiver. To make an ice pack, do one of the following:  Place crushed ice or a bag of frozen vegetables in a sealable plastic bag. Squeeze out the excess air. Place this bag inside another plastic bag. Slide the bag into a pillowcase or place a damp towel between your skin and the bag.  Mix 3 parts water with 1 part rubbing alcohol. Freeze the mixture in a sealable plastic bag. When you remove the mixture from the freezer, it will be slushy. Squeeze out the excess air. Place this bag inside another plastic bag. Slide the bag into a pillowcase or place a damp towel between your skin and the bag. SEEK MEDICAL CARE IF:  You develop white spots on your skin. This may give the skin a blotchy (mottled) appearance.  Your skin turns blue or pale.  Your skin becomes waxy or hard.  Your swelling gets worse. MAKE SURE YOU:   Understand these instructions.  Will watch your condition.  Will get help right away if you are not doing well or get worse.   This information is not intended to replace advice given to you by your health care provider. Make sure you discuss any questions you have with your health care provider.   Document Released: 05/25/2011 Document Revised: 10/19/2014 Document Reviewed: 05/25/2011 Elsevier Interactive Patient Education [employer].

## 2016-08-24 ENCOUNTER — Ambulatory Visit (INDEPENDENT_AMBULATORY_CARE_PROVIDER_SITE_OTHER): Payer: BLUE CROSS/BLUE SHIELD | Admitting: Urgent Care

## 2016-08-24 VITALS — BP 116/74 | HR 70 | Temp 98.1°F | Resp 18 | Ht 65.0 in | Wt 150.4 lb

## 2016-08-24 DIAGNOSIS — M25562 Pain in left knee: Secondary | ICD-10-CM | POA: Diagnosis not present

## 2016-08-24 DIAGNOSIS — Z026 Encounter for examination for insurance purposes: Secondary | ICD-10-CM

## 2016-08-24 MED ORDER — DICLOFENAC SODIUM 75 MG PO TBEC: 75.0000 mg | DELAYED_RELEASE_TABLET | Freq: Three times a day (TID) | ORAL | 0 refills | Status: DC

## 2016-08-24 NOTE — Patient Instructions (Addendum)
Knee Pain Knee pain is a very common symptom and can have many causes. Knee pain often goes away when you follow your health care provider's instructions for relieving pain and discomfort at home. However, knee pain can develop into a condition that needs treatment. Some conditions may include:  Arthritis caused by wear and tear (osteoarthritis).  Arthritis caused by swelling and irritation (rheumatoid arthritis or gout).  A cyst or growth in your knee.  An infection in your knee joint.  An injury that will not heal.  Damage, swelling, or irritation of the tissues that support your knee (torn ligaments or tendinitis). If your knee pain continues, additional tests may be ordered to diagnose your condition. Tests may include X-rays or other imaging studies of your knee. You may also need to have fluid removed from your knee. Treatment for ongoing knee pain depends on the cause, but treatment may include:  Medicines to relieve pain or swelling.  Steroid injections in your knee.  Physical therapy.  Surgery. HOME CARE INSTRUCTIONS  Take medicines only as directed by your health care provider.  Rest your knee and keep it raised (elevated) while you are resting.  Do not do things that cause or worsen pain.  Avoid high-impact activities or exercises, such as running, jumping rope, or doing jumping jacks.  Apply ice to the knee area:  Put ice in a plastic bag.  Place a towel between your skin and the bag.  Leave the ice on for 20 minutes, 2-3 times a day.  Ask your health care provider if you should wear an elastic knee support.  Keep a pillow under your knee when you sleep.  Lose weight if you are overweight. Extra weight can put pressure on your knee.  Do not use any tobacco products, including cigarettes, chewing tobacco, or electronic cigarettes. If you need help quitting, ask your health care provider. Smoking may slow the healing of any bone and joint problems that you may  have. SEEK MEDICAL CARE IF:  Your knee pain continues, changes, or gets worse.  You have a fever along with knee pain.  Your knee buckles or locks up.  Your knee becomes more swollen. SEEK IMMEDIATE MEDICAL CARE IF:   Your knee joint feels hot to the touch.  You have chest pain or trouble breathing.   This information is not intended to replace advice given to you by your health care provider. Make sure you discuss any questions you have with your health care provider.   Document Released: 07/26/2007 Document Revised: 10/19/2014 Document Reviewed: 05/14/2014 Elsevier Interactive Patient Education 2016 [employer].     IF you received an x-ray today, you will receive an invoice from [facility]. Please contact [facility] at [phone] with questions or concerns regarding your invoice.   IF you received labwork today, you will receive an invoice from [employer]. Please contact [facility] at [phone] with questions or concerns regarding your invoice.   Our billing staff will not be able to assist you with questions regarding bills from these companies.  You will be contacted with the lab results as soon as they are available. The fastest way to get your results is to activate your My Chart account. Instructions are located on the last page of this paperwork. If you have not heard from [place] regarding the results in 2 weeks, please contact this office.

## 2016-08-24 NOTE — Progress Notes (Signed)
    MRN: [id number] DOB: 11-21-1975  Subjective:   Sabrina [name] is a 40 y.o. female presenting for follow up on left knee pain. Patient has been seen twice for the same, 08/07/2016 and 08/17/2016, started on diclofenac gel then oral for tendinitis. Today, patient reports that she is improved but not 100%. 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 Has to stand a lot at work. She thinks that Voltaren is helping, also wears a knee brace from [place]. Her knee pain is intermittent, feels like a point tenderness. It is relieved by keeping her knee straighter and worsened with consistent bending, flexing of the knee. She previously had swelling of her knee but this has improved dramatically. Denies fever, swelling, trauma, redness, warmth, knee buckling.  Anamaria has a current medication list, allergies, pmh and psh that was updated as necessary and not included due to being a worker's compensation case.  Objective:   Vitals: BP 116/74   Pulse 70   Temp 98.1 F (36.7 C) (Oral)   Resp 18   Ht 5\' 5"  (1.651 m)   Wt 150 lb 6.4 oz (68.2 kg)   LMP 08/21/2016   SpO2 100%   BMI 25.03 kg/m   Physical Exam  Constitutional: She is oriented to person, place, and time. She appears well-developed and well-nourished.  Cardiovascular: Normal rate.   Pulmonary/Chest: Effort normal.  Musculoskeletal:       Left knee: She exhibits normal range of motion, no swelling, no effusion, no ecchymosis, no deformity, no laceration, no erythema, normal alignment and normal patellar mobility. Tenderness found. Medial joint line tenderness noted.  Negative McMurray test.  Neurological: She is alert and oriented to person, place, and time.   Assessment and Plan :   1. Encounter related to worker's compensation claim 2. Left medial knee pain - Improved, I suspect patient has tendinitis that needs more consistent rest/conservative management. We will do this for 1 week. I believe that a knee  injection may be a bit aggressive given her improvement, patient agrees. Consider referral to ortho if this knee pain persists.  Jaynee Eagles, PA-C [facility] [phone] 08/24/2016 2:17 PM

## 2016-11-24 ENCOUNTER — Ambulatory Visit: Payer: Self-pay

## 2016-11-24 ENCOUNTER — Ambulatory Visit (INDEPENDENT_AMBULATORY_CARE_PROVIDER_SITE_OTHER): Payer: BLUE CROSS/BLUE SHIELD | Admitting: Student

## 2016-11-24 VITALS — BP 92/60 | HR 59 | Temp 98.2°F | Resp 16 | Ht 65.0 in | Wt 155.4 lb

## 2016-11-24 DIAGNOSIS — J069 Acute upper respiratory infection, unspecified: Secondary | ICD-10-CM

## 2016-11-24 NOTE — Assessment & Plan Note (Addendum)
Symptomatic care given.  Return to care precautions given.  Work note given to prevent spread.

## 2016-11-24 NOTE — Progress Notes (Signed)
   Subjective:    Patient ID: Sabrina [name], female    DOB: 1975-11-06, 41 y.o.   MRN: [id number]  HPI Denies fevers, chills.  Has had nasal congestion, sore throat, cough that is nonproductive.  Has been ongoing for 1-2 days.  She works as a Geophysicist/field seismologist.  She believes some of her clients have been sick.  Denies myalgias, does have malaise.  Poor appetite, but no nausea or vomiting.    PMHx - Updated and reviewed.  Contributory factors include: Negative PSHx - Updated and reviewed.  Contributory factors include:  Negative FHx - Updated and reviewed.  Contributory factors include:  Negative Social Hx - Updated and reviewed. Contributory factors include: Negative Medications - reviewed    Review of Systems  Constitutional: Positive for appetite change. Negative for chills, fatigue, fever and unexpected weight change.  HENT: Positive for congestion, rhinorrhea and sore throat. Negative for ear discharge, ear pain and trouble swallowing.   Respiratory: Positive for cough. Negative for chest tightness and shortness of breath.   Cardiovascular: Negative for chest pain, palpitations and leg swelling.  Gastrointestinal: Negative for abdominal pain and nausea.  Genitourinary: Negative for dysuria and urgency.  Musculoskeletal: Negative for arthralgias, joint swelling and myalgias.  Skin: Negative for rash and wound.  Psychiatric/Behavioral: Negative for agitation and confusion.  All other systems reviewed and are negative.      Objective:   Physical Exam  Constitutional: She is oriented to person, place, and time. She appears well-developed and well-nourished. No distress.  HENT:  Head: Normocephalic and atraumatic.  Right Ear: External ear normal.  Left Ear: External ear normal.  Mouth/Throat: No oropharyngeal exudate.  Bilateral nasal erythema and edema  Eyes: Right eye exhibits no discharge. Left eye exhibits no discharge. No scleral icterus.  Neck: Normal range of motion. Neck  supple.  Cardiovascular: Normal rate, regular rhythm, normal heart sounds and intact distal pulses.  Exam reveals no gallop and no friction rub.   No murmur heard. Pulmonary/Chest: Effort normal and breath sounds normal. No respiratory distress. She has no wheezes. She has no rales. She exhibits no tenderness.  Musculoskeletal: Normal range of motion. She exhibits no edema.  Lymphadenopathy:    She has no cervical adenopathy.  Neurological: She is alert and oriented to person, place, and time.  Skin: Skin is warm. No rash noted. She is not diaphoretic. No erythema.  Psychiatric: She has a normal mood and affect. Her behavior is normal. Judgment and thought content normal.  Nursing note and vitals reviewed.  BP 92/60   Pulse (!) 59   Temp 98.2 F (36.8 C) (Oral)   Resp 16   Ht 5\' 5"  (1.651 m)   Wt 155 lb 6.4 oz (70.5 kg)   SpO2 98%   BMI 25.86 kg/m         Assessment & Plan:  Acute upper respiratory infection Symptomatic care given.  Return to care precautions given.  Work note given to prevent spread.  Signed,  Balinda Quails, DO [facility] 3:50 PM 11/24/16

## 2016-11-24 NOTE — Patient Instructions (Addendum)
  Nasal saline sprays twice a day.  Warm salt gargles 3-4 times a day  Hot tea with lemon and honey.   IF you received an x-ray today, you will receive an invoice from [facility]. Please contact [facility] at [phone] with questions or concerns regarding your invoice.   IF you received labwork today, you will receive an invoice from [facility]. Please contact [facility] at [phone] with questions or concerns regarding your invoice.   Our billing staff will not be able to assist you with questions regarding bills from these companies.  You will be contacted with the lab results as soon as they are available. The fastest way to get your results is to activate your My Chart account. Instructions are located on the last page of this paperwork. If you have not heard from [place] regarding the results in 2 weeks, please contact this office.

## 2016-12-05 ENCOUNTER — Ambulatory Visit: Payer: Self-pay

## 2017-03-19 ENCOUNTER — Ambulatory Visit: Payer: Worker's Compensation | Admitting: Physician Assistant

## 2017-03-19 ENCOUNTER — Ambulatory Visit: Payer: BLUE CROSS/BLUE SHIELD | Admitting: Urgent Care

## 2017-03-23 ENCOUNTER — Ambulatory Visit: Payer: Self-pay | Admitting: Urgent Care

## 2017-03-24 ENCOUNTER — Encounter: Payer: Self-pay | Admitting: Physician Assistant

## 2017-03-24 ENCOUNTER — Telehealth: Payer: Self-pay

## 2017-03-24 ENCOUNTER — Ambulatory Visit (INDEPENDENT_AMBULATORY_CARE_PROVIDER_SITE_OTHER): Payer: BLUE CROSS/BLUE SHIELD | Admitting: Physician Assistant

## 2017-03-24 VITALS — BP 104/62 | HR 64 | Temp 98.1°F | Resp 16 | Ht 65.0 in | Wt 145.8 lb

## 2017-03-24 DIAGNOSIS — M25512 Pain in left shoulder: Secondary | ICD-10-CM | POA: Diagnosis not present

## 2017-03-24 MED ORDER — CYCLOBENZAPRINE HCL 10 MG PO TABS: 10.0000 mg | ORAL_TABLET | Freq: Three times a day (TID) | ORAL | 0 refills | Status: DC | PRN

## 2017-03-24 NOTE — Progress Notes (Signed)
     Sabrina [name] [birth year] 41 y.o.   Chief Complaint  Patient presents with  . Shoulder Injury    Left shoulder pain since Last Friday     Presents for evaluation of work-related complaint.  Date of Injury: Tuesday, 03/16/2017  History of Present Illness: LEFT shoulder pain following giving a deep tissue massage at work on 03/16/2017. The following days at work she had increasing pain along the LEFT shoulder blade, described as throbbing, and along the upper LEFT arm. Previous pain like this following similar activity has resolved without treatment, and has not been this bad.  Ibuprofen, espom salt soaks, rest and gentle ROM have helped.  She tried to be seen here on Friday 6/08, but the treatment authorization was not sent by her employer, hence the delay in evaluation.    ROS Cardiovascular: Negative for chest pain and palpitations.  Musculoskeletal: Positive for arthralgias (left shoulder) and myalgias (in latissamus dorsi). Negative for back pain, joint swelling, neck pain and neck stiffness.  Neurological: Positive for weakness. Negative for numbness.  Psychiatric/Behavioral: Negative for sleep disturbance.     Current medications and allergies reviewed and updated. Past medical history, family history, social history have been reviewed and updated.   Physical Exam  Constitutional: She is oriented to person, place, and time and well-developed, well-nourished, and in no distress.  BP 104/62   Pulse 64   Temp 98.1 F (36.7 C) (Oral)   Resp 16   Ht 5\' 5"  (1.651 m)   Wt 145 lb 12.8 oz (66.1 kg)   LMP 03/06/2017   SpO2 97%   BMI 24.26 kg/m    HENT:  Head: Normocephalic and atraumatic.  Right Ear: Hearing and external ear normal.  Left Ear: Hearing and external ear normal.  Eyes: Conjunctivae, EOM and lids are normal. Pupils are equal, round, and reactive to light. Right eye exhibits no discharge. Left eye exhibits no discharge. No scleral icterus.  Neck:  Normal range of motion and phonation normal. Neck supple. No thyromegaly present.  Cardiovascular: Normal rate, regular rhythm, normal heart sounds and intact distal pulses.   Pulmonary/Chest: Effort normal and breath sounds normal.  Musculoskeletal:       Left shoulder: She exhibits decreased range of motion, tenderness, bony tenderness, pain and decreased strength. She exhibits no swelling, no effusion, no crepitus, no deformity, no laceration, no spasm and normal pulse.       Arms: Lymphadenopathy:    She has no cervical adenopathy.  Neurological: She is alert and oriented to person, place, and time. Gait normal.  Skin: Skin is warm, dry and intact. No lesion and no rash noted.  Psychiatric: Mood, memory, affect and judgment normal.  Normal speech and behavior.     Assessment and Plan: 1. Acute pain of left shoulder Continue NSAIDS, rest, gentle ROM. Add muscle relaxer and ice. - cyclobenzaprine (FLEXERIL) 10 MG tablet; Take 1 tablet (10 mg total) by mouth 3 (three) times daily as needed for muscle spasms. Dispense: 30 tablet; Refill: 0   Return in 3 days (on 03/27/2017) for re-evaluation of shoulder/arm pain.   Fara Chute, PA-C [facility]

## 2017-03-24 NOTE — Telephone Encounter (Signed)
Pt is needing to talk with Sabrina [name] regarding questions about her out of work note and not being able to use her left arm -her work is stating that she has work to do that does not involve using her left arm but it does and she feels that she needs her note revised   Best number [phone]

## 2017-03-24 NOTE — Patient Instructions (Addendum)
STOP the ibuprofen. RESUME the diclofenac (Voltaren). START the cyclobenzaprine (Flexeril).  Use warm compresses, gentle range of motion, and massage.    IF you received an x-ray today, you will receive an invoice from [facility]. Please contact [facility] at [phone] with questions or concerns regarding your invoice.   IF you received labwork today, you will receive an invoice from [facility]. Please contact [facility] at [phone] with questions or concerns regarding your invoice.   Our billing staff will not be able to assist you with questions regarding bills from these companies.  You will be contacted with the lab results as soon as they are available. The fastest way to get your results is to activate your My Chart account. Instructions are located on the last page of this paperwork. If you have not heard from [place] regarding the results in 2 weeks, please contact this office.

## 2017-03-24 NOTE — Progress Notes (Signed)
Subjective:    Patient ID: Sabrina [name], female    DOB: [birth year], 41 y.o.   MRN: [id number] PCP: .Default, Provider, MD Chief Complaint  Patient presents with  . Shoulder Injury    Left shoulder pain since Last Friday     HPI: 41 y/o F presents to clinic for left shoulder pain. She attempted to be seen on Friday 03/19/2017 but she did not have the appropriate workers comp. Authorization paperwork. She works as a Geophysicist/field seismologist at [employer].  Her left shoulder began hurting on 03/16/2017 after working with a client who requested a deeper massage. She worked again Thursday with increasing pain and Friday she had the most pain that she describes as throbbing. The pain is located along the scapular spine, along her lateral rib cage on her left side, and along the medial aspect of there proximal humerus. She describes the pain as throbbing. She denies hearing a popping sound or having a direct injury to the area. She has experienced this before and associates it with the repetitive use with her job. She has been trying to rest her arm but had to work on Tuesday. Has been using ibuprofen, epsulm salt baths, rest and light ROM, which have all helped make it feel better.   The pain is not limiting her ability to sleep.    Review of Systems  Cardiovascular: Negative for chest pain and palpitations.  Musculoskeletal: Positive for arthralgias (left shoulder) and myalgias (in latissamus dorsi). Negative for back pain, joint swelling, neck pain and neck stiffness.  Neurological: Positive for weakness. Negative for numbness.  Psychiatric/Behavioral: Negative for sleep disturbance.       Objective:   Physical Exam  Constitutional: She is oriented to person, place, and time. She appears well-developed and well-nourished. No distress.  BP 104/62   Pulse 64   Temp 98.1 F (36.7 C) (Oral)   Resp 16   Ht 5\' 5"  (1.651 m)   Wt 145 lb 12.8 oz (66.1 kg)   SpO2 97%   BMI 24.26 kg/m    HENT:    Head: Normocephalic and atraumatic.  Neck: Normal range of motion. Neck supple.  Cardiovascular: Normal rate, regular rhythm, normal heart sounds and intact distal pulses.   Pulmonary/Chest: Effort normal and breath sounds normal.  Musculoskeletal: Normal range of motion. She exhibits tenderness. She exhibits no edema or deformity.       Left shoulder: She exhibits tenderness and pain. She exhibits normal range of motion and no deformity.       Arms: Pain in top of left shoulder with neck flexion. Weakness against resistance when attempting to forward flex left arm at shoulder joint and with abduction of left arm at shoulder joint.   Lymphadenopathy:    She has no cervical adenopathy.  Neurological: She is alert and oriented to person, place, and time.  Skin: Skin is warm and dry. No rash noted. She is not diaphoretic. No erythema.  Psychiatric: She has a normal mood and affect. Her behavior is normal. Judgment and thought content normal.          Assessment & Plan:  1. Acute pain of left shoulder Take Voltaren as prescribed for pain in left shoulder. Take Flexeril up to 3 times a day for muscle spasms. Continue to rest your left shoulder, use warm compresses, and light ROM as the inflammation decreases and pain subsides.  - cyclobenzaprine (FLEXERIL) 10 MG tablet; Take 1 tablet (10 mg total) by mouth 3 (three)  times daily as needed for muscle spasms.  Dispense: 30 tablet; Refill: 0  Return in 3 days (on 03/27/2017) for re-evaluation of shoulder/arm pain.

## 2017-03-24 NOTE — Telephone Encounter (Signed)
NO USE LEFT ARM, is pretty clear. She cannot use the LEFT arm. If they do not have work that she can do WITHOUT using the LEFT arm, then she cannot work.

## 2017-03-24 NOTE — Telephone Encounter (Signed)
Chelle please advise

## 2017-03-25 NOTE — Telephone Encounter (Signed)
lmvm to call us back  

## 2017-03-27 ENCOUNTER — Encounter: Payer: Self-pay | Admitting: Physician Assistant

## 2017-03-27 ENCOUNTER — Ambulatory Visit (INDEPENDENT_AMBULATORY_CARE_PROVIDER_SITE_OTHER): Payer: BLUE CROSS/BLUE SHIELD | Admitting: Physician Assistant

## 2017-03-27 VITALS — BP 108/68 | HR 65 | Temp 98.4°F | Resp 16 | Ht 63.5 in | Wt 146.4 lb

## 2017-03-27 DIAGNOSIS — M25512 Pain in left shoulder: Secondary | ICD-10-CM | POA: Diagnosis not present

## 2017-03-27 NOTE — Patient Instructions (Addendum)
Continue performing ROM exercises and using warm compresses. You should do light duty work today and then make sure you get good rest and recovery over the weekend. Use volatren and flexeril as needed. I anticipate you should be able to return to full duty after your visit on Tuesday. Follow up on 03/30/17. Thank you for letting me participate in your health and well being.    IF you received an x-ray today, you will receive an invoice from [facility]. Please contact [facility] at [phone] with questions or concerns regarding your invoice.   IF you received labwork today, you will receive an invoice from [facility]. Please contact [facility] at [phone] with questions or concerns regarding your invoice.   Our billing staff will not be able to assist you with questions regarding bills from these companies.  You will be contacted with the lab results as soon as they are available. The fastest way to get your results is to activate your My Chart account. Instructions are located on the last page of this paperwork. If you have not heard from [place] regarding the results in 2 weeks, please contact this office.

## 2017-03-27 NOTE — Progress Notes (Signed)
    MRN: [id number] DOB: Mar 06, 1976  Subjective:   Sabrina [name] is a 41 y.o. female presenting for chief complaint of Follow-up (Worker's Comp-Shoulder/arm pain)   Pt initially seen 3 days ago by [facility] for left arm pain that occurred at work after given a massage to a client who requested a deeper massage. She was having a throbbing pain along her left scapular spine. PE revealed pain in left shoulder with neck flexion and weakness against resistance. Was instructed to use diclofenac, flexeril, warm compresses, gentle ROM, and massage. Given work restrictions to not use left arm until follow up appointment in 3 days. Today patient presents and notes the pain has completely resolved. She used the medication as prescribed and has performed gentle ROM exercises with success.  Denies muscle tightness, pain, decreased ROM, numbness, tingling, and weakness.   Sabrina [name]'s medications list, allergies, past medical history and past surgical history were reviewed and excluded from this note due to being a worker's comp case.   Objective:   Vitals: BP 108/68   Pulse 65   Temp 98.4 F (36.9 C) (Oral)   Resp 16   Ht 5' 3.5" (1.613 m)   Wt 146 lb 6 oz (66.4 kg)   LMP 03/06/2017   SpO2 98%   BMI 25.52 kg/m   Physical Exam  Constitutional: She is oriented to person, place, and time. She appears well-developed and well-nourished.  HENT:  Head: Normocephalic and atraumatic.  Eyes: Conjunctivae are normal.  Neck: Normal range of motion.  Pulmonary/Chest: Effort normal.  Musculoskeletal:       Right shoulder: Normal.       Left shoulder: She exhibits tenderness (mild tenderness noted with neck flexion, no tenderness to palpation of trapezius). She exhibits normal range of motion, no effusion, no spasm and normal strength (no pain when resistance applied).       Cervical back: Normal.  Neurological: She is alert and oriented to person, place, and time. No sensory deficit.  Reflex Scores:     Tricep reflexes are 2+ on the right side and 2+ on the left side.      Bicep reflexes are 2+ on the right side and 2+ on the left side.      Brachioradialis reflexes are 2+ on the right side and 2+ on the left side. Skin: Skin is warm and dry.  Psychiatric: She has a normal mood and affect.  Vitals reviewed.   No results found for this or any previous visit (from the past 24 hour(s)).  Assessment and Plan :  1. Acute pain of left shoulder Instructed to continue performing ROM exercises and using warm compresses. Take voltaren and flexeril as needed. Advance activity with left arm as tolerated. Recommended light duty today. She then has two days off to continue to rest and perform ROM exercises. Instructed to return to office on Tuesday, 03/30/17 for revaluation. Anticipate she will be able to return to work with no restrictions at that time.   Tenna Delaine, PA-C  [facility] 03/27/2017 12:47 PM

## 2017-03-30 NOTE — Telephone Encounter (Signed)
Pt seen by Corey Harold 6/16 and work note was revised

## 2017-07-03 ENCOUNTER — Ambulatory Visit (INDEPENDENT_AMBULATORY_CARE_PROVIDER_SITE_OTHER): Payer: BLUE CROSS/BLUE SHIELD | Admitting: Family Medicine

## 2017-07-03 ENCOUNTER — Encounter: Payer: Self-pay | Admitting: Family Medicine

## 2017-07-03 VITALS — BP 111/71 | HR 61 | Temp 98.1°F | Resp 16 | Ht 65.0 in | Wt 142.4 lb

## 2017-07-03 DIAGNOSIS — R41840 Attention and concentration deficit: Secondary | ICD-10-CM | POA: Diagnosis not present

## 2017-07-03 DIAGNOSIS — E0590 Thyrotoxicosis, unspecified without thyrotoxic crisis or storm: Secondary | ICD-10-CM | POA: Diagnosis not present

## 2017-07-03 DIAGNOSIS — F411 Generalized anxiety disorder: Secondary | ICD-10-CM | POA: Diagnosis not present

## 2017-07-03 MED ORDER — CITALOPRAM HYDROBROMIDE 20 MG PO TABS: ORAL_TABLET | ORAL | 1 refills | Status: DC

## 2017-07-03 NOTE — Patient Instructions (Addendum)
For assessment of mood: Dr. Vevelyn Pat Akintayo  3.8 (23)  Psychiatrist  [place]  [phone]    IF you received an x-ray today, you will receive an invoice from [facility]. Please contact [facility] at [phone] with questions or concerns regarding your invoice.   IF you received labwork today, you will receive an invoice from [facility]. Please contact [facility] at [phone] with questions or concerns regarding your invoice.   Our billing staff will not be able to assist you with questions regarding bills from these companies.  You will be contacted with the lab results as soon as they are available. The fastest way to get your results is to activate your My Chart account. Instructions are located on the last page of this paperwork. If you have not heard from [place] regarding the results in 2 weeks, please contact this office.      Generalized Anxiety Disorder, Adult Generalized anxiety disorder (GAD) is a mental health disorder. People with this condition constantly worry about everyday events. Unlike normal anxiety, worry related to GAD is not triggered by a specific event. These worries also do not fade or get better with time. GAD interferes with life functions, including relationships, work, and school. GAD can vary from mild to severe. People with severe GAD can have intense waves of anxiety with physical symptoms (panic attacks). What are the causes? The exact cause of GAD is not known. What increases the risk? This condition is more likely to develop in:  Women.  People who have a family history of anxiety disorders.  People who are very shy.  People who experience very stressful life events, such as the death of a loved one.  People who have a very stressful family environment.  What are the signs or symptoms? People with GAD often worry excessively about many things in their lives, such as their health and family. They may also be overly  concerned about:  Doing well at work.  Being on time.  Natural disasters.  Friendships.  Physical symptoms of GAD include:  Fatigue.  Muscle tension or having muscle twitches.  Trembling or feeling shaky.  Being easily startled.  Feeling like your heart is pounding or racing.  Feeling out of breath or like you cannot take a deep breath.  Having trouble falling asleep or staying asleep.  Sweating.  Nausea, diarrhea, or irritable bowel syndrome (IBS).  Headaches.  Trouble concentrating or remembering facts.  Restlessness.  Irritability.  How is this diagnosed? Your health care provider can diagnose GAD based on your symptoms and medical history. You will also have a physical exam. The health care provider will ask specific questions about your symptoms, including how severe they are, when they started, and if they come and go. Your health care provider may ask you about your use of alcohol or drugs, including prescription medicines. Your health care provider may refer you to a mental health specialist for further evaluation. Your health care provider will do a thorough examination and may perform additional tests to rule out other possible causes of your symptoms. To be diagnosed with GAD, a person must have anxiety that:  Is out of his or her control.  Affects several different aspects of his or her life, such as work and relationships.  Causes distress that makes him or her unable to take part in normal activities.  Includes at least three physical symptoms of GAD, such as restlessness, fatigue, trouble concentrating, irritability, muscle tension, or sleep  problems.  Before your health care provider can confirm a diagnosis of GAD, these symptoms must be present more days than they are not, and they must last for six months or longer. How is this treated? The following therapies are usually used to treat GAD:  Medicine. Antidepressant medicine is usually  prescribed for long-term daily control. Antianxiety medicines may be added in severe cases, especially when panic attacks occur.  Talk therapy (psychotherapy). Certain types of talk therapy can be helpful in treating GAD by providing support, education, and guidance. Options include: ? Cognitive behavioral therapy (CBT). People learn coping skills and techniques to ease their anxiety. They learn to identify unrealistic or negative thoughts and behaviors and to replace them with positive ones. ? Acceptance and commitment therapy (ACT). This treatment teaches people how to be mindful as a way to cope with unwanted thoughts and feelings. ? Biofeedback. This process trains you to manage your body's response (physiological response) through breathing techniques and relaxation methods. You will work with a therapist while machines are used to monitor your physical symptoms.  Stress management techniques. These include yoga, meditation, and exercise.  A mental health specialist can help determine which treatment is best for you. Some people see improvement with one type of therapy. However, other people require a combination of therapies. Follow these instructions at home:  Take over-the-counter and prescription medicines only as told by your health care provider.  Try to maintain a normal routine.  Try to anticipate stressful situations and allow extra time to manage them.  Practice any stress management or self-calming techniques as taught by your health care provider.  Do not punish yourself for setbacks or for not making progress.  Try to recognize your accomplishments, even if they are small.  Keep all follow-up visits as told by your health care provider. This is important. Contact a health care provider if:  Your symptoms do not get better.  Your symptoms get worse.  You have signs of depression, such as: ? A persistently sad, cranky, or irritable mood. ? Loss of enjoyment in  activities that used to bring you joy. ? Change in weight or eating. ? Changes in sleeping habits. ? Avoiding friends or family members. ? Loss of energy for normal tasks. ? Feelings of guilt or worthlessness. Get help right away if:  You have serious thoughts about hurting yourself or others. If you ever feel like you may hurt yourself or others, or have thoughts about taking your own life, get help right away. You can go to your nearest emergency department or call:  Your local emergency services (911 in the [place]  A suicide crisis helpline, such as the [place] at (720)733-7171. This is open 24 hours a day.  Summary  Generalized anxiety disorder (GAD) is a mental health disorder that involves worry that is not triggered by a specific event.  People with GAD often worry excessively about many things in their lives, such as their health and family.  GAD may cause physical symptoms such as restlessness, trouble concentrating, sleep problems, frequent sweating, nausea, diarrhea, headaches, and trembling or muscle twitching.  A mental health specialist can help determine which treatment is best for you. Some people see improvement with one type of therapy. However, other people require a combination of therapies. This information is not intended to replace advice given to you by your health care provider. Make sure you discuss any questions you have with your health care provider. Document Released: 01/23/2013  Document Revised: 08/18/2016 Document Reviewed: 08/18/2016 Elsevier Interactive Patient Education  [employer].

## 2017-07-03 NOTE — Progress Notes (Signed)
Chief Complaint  Patient presents with  . Anxiety    HPI   Pt reports that her episodes of anxiety started at age 41 At age 45 she fell and hit her head and feels like the bony prominence of her head feels like there is fluid in it She feels like she is having difficulty memory and cognition She reports that she is beginning to shake more and has difficulty tying her shoes She continuously combs her hair with her fingers  But she is not pulling her hear strands out She reports that she has less motivation to clean and organize her space She feels like she wants to do it but she does not have much energy    GAD 7 : Generalized Anxiety Score 07/03/2017  Nervous, Anxious, on Edge 2  Control/stop worrying 0  Worry too much - different things 0  Trouble relaxing 1  Restless 1  Easily annoyed or irritable 1  Afraid - awful might happen 0  Total GAD 7 Score 5  Anxiety Difficulty Not difficult at all   Depression screen [facility] 2/9 07/03/2017 03/27/2017 03/24/2017 11/24/2016 08/24/2016  Decreased Interest 0 0 0 0 0  Down, Depressed, Hopeless 0 0 0 0 0  PHQ - 2 Score 0 0 0 0 0      Past Medical History:  Diagnosis Date  . Anemia   . Anxiety   . Depression   . Fibroids     Current Outpatient Prescriptions  Medication Sig Dispense Refill  . citalopram (CELEXA) 20 MG tablet Take 1/2 tablet for 3 days then increase to one tablet daily 30 tablet 1   No current facility-administered medications for this visit.     Allergies: No Known Allergies  Past Surgical History:  Procedure Laterality Date  . MYOMECTOMY    . RECTAL EXAMINATION UNDER ANESTHESIA W/ CYSTOSCOPY      Social History   Social History  . Marital status: Single    Spouse name: N/A  . Number of children: N/A  . Years of education: N/A   Social History Main Topics  . Smoking status: Former Smoker    Types: Cigarettes    Quit date: 11/06/2014  . Smokeless tobacco: Never Used  . Alcohol use No  . Drug use: No   . Sexual activity: Not Asked   Other Topics Concern  . None   Social History Narrative  . None    Review of Systems  Constitutional: Negative for chills and fever.  Respiratory: Negative for cough, shortness of breath and wheezing.   Cardiovascular: Negative for chest pain and palpitations.  Gastrointestinal: Negative for heartburn, nausea and vomiting.  Genitourinary: Negative for dysuria and frequency.  Skin: Negative for itching and rash.  Neurological: Negative for dizziness, tingling and headaches.  Psychiatric/Behavioral:       See hpi    Objective: Vitals:   07/03/17 0951  BP: 111/71  Pulse: 61  Resp: 16  Temp: 98.1 F (36.7 C)  TempSrc: Oral  SpO2: 94%  Weight: 142 lb 6.4 oz (64.6 kg)  Height: 5\' 5"  (1.651 m)    Physical Exam  Constitutional: She is oriented to person, place, and time. She appears well-developed and well-nourished.  HENT:  Head: Normocephalic and atraumatic.  Right Ear: External ear normal.  Left Ear: External ear normal.  Nose: Nose normal.  Mouth/Throat: Oropharynx is clear and moist.  Eyes: Pupils are equal, round, and reactive to light. Conjunctivae and EOM are normal.  Fundoscopic exam:  The right eye shows no AV nicking, no exudate, no hemorrhage and no papilledema.       The left eye shows no AV nicking, no exudate, no hemorrhage and no papilledema.  Neck: Normal range of motion. No thyromegaly present.  Cardiovascular: Normal rate, regular rhythm and normal heart sounds.   No murmur heard. Pulmonary/Chest: Effort normal and breath sounds normal. No respiratory distress. She has no wheezes.  Musculoskeletal: Normal range of motion. She exhibits no edema.  Neurological: She is alert and oriented to person, place, and time. She displays normal reflexes. No cranial nerve deficit or sensory deficit. She exhibits normal muscle tone. Coordination normal.  Skin: Skin is warm. Capillary refill takes less than 2 seconds. No erythema.    Psychiatric: She has a normal mood and affect. Her behavior is normal. Judgment and thought content normal.    Assessment and Plan Joyclyn was seen today for anxiety.  Diagnoses and all orders for this visit:  Attention deficit- advised to go to Psychiatry for assessment -     TSH -     CBC -     Comprehensive metabolic panel -     Ambulatory referral to Psychiatry  Anxiety state- based on patient affect and history will screen for underlying causes Will start celexa and titrate dose -     TSH -     CBC -     Comprehensive metabolic panel -     citalopram (CELEXA) 20 MG tablet; Take 1/2 tablet for 3 days then increase to one tablet daily -     Ambulatory referral to Psychiatry  Other orders -     Cancel: Tdap vaccine greater than or equal to 7yo IM -     Cancel: Flu Vaccine QUAD 36+ mos IM   A total of 40 minutes were spent face-to-face with the patient during this encounter and over half of that time was spent on counseling and coordination of care.    [place]

## 2017-07-04 ENCOUNTER — Encounter (HOSPITAL_COMMUNITY): Payer: Self-pay | Admitting: *Deleted

## 2017-07-04 ENCOUNTER — Emergency Department (HOSPITAL_COMMUNITY)
Admission: EM | Admit: 2017-07-04 | Discharge: 2017-07-04 | Discharge status: Against Medical Advice | Payer: BLUE CROSS/BLUE SHIELD | Attending: Emergency Medicine | Admitting: Emergency Medicine

## 2017-07-04 DIAGNOSIS — R112 Nausea with vomiting, unspecified: Secondary | ICD-10-CM | POA: Diagnosis present

## 2017-07-04 DIAGNOSIS — Z5321 Procedure and treatment not carried out due to patient leaving prior to being seen by health care provider: Secondary | ICD-10-CM | POA: Diagnosis not present

## 2017-07-04 LAB — COMPREHENSIVE METABOLIC PANEL
ALT: 15 IU/L (ref 0–32)
AST: 22 IU/L (ref 0–40)
Albumin/Globulin Ratio: 1.4 (ref 1.2–2.2)
Albumin: 4.6 g/dL (ref 3.5–5.5)
Alkaline Phosphatase: 75 IU/L (ref 39–117)
BUN / CREAT RATIO: 10 (ref 9–23)
BUN: 7 mg/dL (ref 6–20)
Bilirubin Total: 0.3 mg/dL (ref 0.0–1.2)
CO2: 23 mmol/L (ref 20–29)
CREATININE: 0.71 mg/dL (ref 0.57–1.00)
Calcium: 9.3 mg/dL (ref 8.7–10.2)
Chloride: 100 mmol/L (ref 96–106)
GFR calc non Af Amer: 108 mL/min/{1.73_m2} (ref >59)
GFR, EST AFRICAN AMERICAN: 125 mL/min/{1.73_m2} (ref >59)
GLUCOSE: 84 mg/dL (ref 65–99)
Globulin, Total: 3.2 g/dL (ref 1.5–4.5)
Potassium: 4.2 mmol/L (ref 3.5–5.2)
Sodium: 137 mmol/L (ref 134–144)
TOTAL PROTEIN: 7.8 g/dL (ref 6.0–8.5)

## 2017-07-04 LAB — CBC
HEMOGLOBIN: 14 g/dL (ref 11.1–15.9)
Hematocrit: 42.9 % (ref 34.0–46.6)
MCH: 31 pg (ref 26.6–33.0)
MCHC: 32.6 g/dL (ref 31.5–35.7)
MCV: 95 fL (ref 79–97)
Platelets: 298 10*3/uL (ref 150–379)
RBC: 4.51 x10E6/uL (ref 3.77–5.28)
RDW: 13.9 % (ref 12.3–15.4)
WBC: 6.9 10*3/uL (ref 3.4–10.8)

## 2017-07-04 LAB — TSH: TSH: 0.442 u[IU]/mL — AB (ref 0.450–4.500)

## 2017-07-04 NOTE — ED Notes (Signed)
PT is feeling much better and would like to leave. Told PT that if the sickness comes back then please return to the ED. Also explain to PT to pickup powerrade drinks to replace any fluids.

## 2017-07-04 NOTE — ED Provider Notes (Signed)
Patient left without being seen  1. Patient left without being seen        Deno Etienne, DO 07/04/17 1855

## 2017-07-04 NOTE — ED Triage Notes (Signed)
PT arrived via Sabrina [name] after she took Celexa for the first time this am at 1000 and then at 1300 started vomiting and got dizzy.  Dizziness better now and now she has a headache. Pt states she feels better now. No deficits noted

## 2017-07-05 NOTE — Addendum Note (Signed)
Addended by: Delia Chimes A on: 07/05/2017 02:33 PM   Modules accepted: Orders

## 2017-07-05 NOTE — Progress Notes (Signed)
Lab Results  Component Value Date   TSH 0.442 (L) 07/03/2017   Will refer to Endocinrology

## 2017-07-08 ENCOUNTER — Encounter: Payer: Self-pay | Admitting: Endocrinology

## 2017-07-08 ENCOUNTER — Ambulatory Visit (INDEPENDENT_AMBULATORY_CARE_PROVIDER_SITE_OTHER): Payer: BLUE CROSS/BLUE SHIELD | Admitting: Endocrinology

## 2017-07-08 DIAGNOSIS — E0590 Thyrotoxicosis, unspecified without thyrotoxic crisis or storm: Secondary | ICD-10-CM | POA: Diagnosis not present

## 2017-07-08 NOTE — Progress Notes (Signed)
Subjective:    Patient ID: Sabrina [name], female    DOB: 12-13-1975, 41 y.o.   MRN: [id number]  HPI Pt is referred by Dr Nolon Rod, for hyperthyroidism.  she was dx'ed with hyperthyroidism a few weeks ago.  she has never been on therapy for this.  she has never had XRT to the anterior neck, or thyroid surgery.  she has never had thyroid imaging.  she does not consume kelp or any other non-prescribed thyroid medication.  she has never been on amiodarone.  She reports slight tremor of the hands, and assoc fatigue.  She says she is not at risk for pregnancy.  Past Medical History:  Diagnosis Date  . Anemia   . Anxiety   . Depression   . Fibroids     Past Surgical History:  Procedure Laterality Date  . MYOMECTOMY    . RECTAL EXAMINATION UNDER ANESTHESIA W/ CYSTOSCOPY      Social History   Social History  . Marital status: Single    Spouse name: N/A  . Number of children: N/A  . Years of education: N/A   Occupational History  . Not on file.   Social History Main Topics  . Smoking status: Former Smoker    Types: Cigarettes    Quit date: 11/06/2014  . Smokeless tobacco: Never Used  . Alcohol use No  . Drug use: No  . Sexual activity: Not on file   Other Topics Concern  . Not on file   Social History Narrative  . No narrative on file    Current Outpatient Prescriptions on File Prior to Visit  Medication Sig Dispense Refill  . citalopram (CELEXA) 20 MG tablet Take 1/2 tablet for 3 days then increase to one tablet daily (Patient not taking: Reported on 07/08/2017) 30 tablet 1   No current facility-administered medications on file prior to visit.     No Known Allergies  Family History  Problem Relation Age of Onset  . Mental illness Father   . Hypertension Mother   . Thyroid disease Neg Hx     BP 104/62   Pulse 61   Wt 142 lb 9.6 oz (64.7 kg)   LMP 06/12/2017   SpO2 98%   BMI 23.73 kg/m     Review of Systems denies weight loss, headache, hoarseness,  visual loss, sob, diarrhea, polyuria, muscle weakness, excessive diaphoresis, seizure, easy bruising, and rhinorrhea.  She has slight anxiety, heat intolerance, and palpitations.      Objective:   Physical Exam VS: see vs page GEN: no distress HEAD: head: no deformity eyes: no periorbital swelling, no proptosis external nose and ears are normal mouth: no lesion seen NECK: 3-4 cm right thyroid nodule CHEST WALL: no deformity LUNGS: clear to auscultation CV: reg rate and rhythm, no murmur ABD: abdomen is soft, nontender.  no hepatosplenomegaly.  not distended.  no hernia MUSCULOSKELETAL: muscle bulk and strength are grossly normal.  no obvious joint swelling.  gait is normal and steady EXTEMITIES: no deformity.  no ulcer on the feet.  feet are of normal color and temp.  no edema PULSES: dorsalis pedis intact bilat.  no carotid bruit NEURO:  cn 2-12 grossly intact.   readily moves all 4's.  sensation is intact to touch on the feet.  Slight tremor of the hands. SKIN:  Normal texture and temperature.  No rash or suspicious lesion is visible.  Not diaphoretic.  NODES:  None palpable at the neck PSYCH: alert, well-oriented.  Does not  appear anxious nor depressed.   Lab Results  Component Value Date   TSH 0.442 (L) 07/03/2017   I have reviewed outside records, and summarized: Pt was noted to have suppressed TSH, and referred here.  She was noted to have tremor.  She also reported the need to continuously comb her hair.     Assessment & Plan:  Hyperthyroidism, mild, usually due to small multinodular goiter.  Tremor, and other sxs, new: we discussed. although TSH suppression is mild, she requests trial of rx--OK with me.   Patient Instructions  The best option is to do the ultrasound and nuclear medicine tests.  Please find out how much your annual deductible is, and let us know if you want to have these done. If as expected, the lump is overactive, you have 3 options: surgery, 1-time  radioactive iodine pill, or a daily medication to slow the thyroid.  Even if you decline the above tests, you can still take daily medication to slow the thyroid, and decide at a later date to do the tests.

## 2017-07-08 NOTE — Patient Instructions (Signed)
The best option is to do the ultrasound and nuclear medicine tests.  Please find out how much your annual deductible is, and let us know if you want to have these done. If as expected, the lump is overactive, you have 3 options: surgery, 1-time radioactive iodine pill, or a daily medication to slow the thyroid.  Even if you decline the above tests, you can still take daily medication to slow the thyroid, and decide at a later date to do the tests.

## 2017-07-09 ENCOUNTER — Telehealth: Payer: Self-pay | Admitting: Endocrinology

## 2017-07-09 MED ORDER — METHIMAZOLE 5 MG PO TABS: 5.0000 mg | ORAL_TABLET | Freq: Every day | ORAL | 2 refills | Status: DC

## 2017-07-09 NOTE — Telephone Encounter (Signed)
Ok, I have sent a prescription to your pharmacy.  If ever you have fever while taking this, stop it and call us, even if the reason is obvious, because of the risk of a rare side-effect. Please come back for a follow-up appointment in 1 month.

## 2017-07-09 NOTE — Telephone Encounter (Signed)
Called patient back & left VM that prescription was sent to pharmacy. Also if she ever has fever with medication to call back ASAP bc this is a risky-rare side effect.

## 2017-07-09 NOTE — Telephone Encounter (Signed)
Patient called in reference to hyperthyroid medication. Patient stated she was told to call and let Dr. Loanne Drilling know if she would like to start this medication. Patient stated she would like to start taking that. Please call patient and advise. OK to leave message. Patient would like any Rx prescribed to go to  [id number] [place], [place] [phone] (Phone) [phone] (Fax)

## 2017-07-09 NOTE — Telephone Encounter (Signed)
Patient called to check on status of note below. Please call patient and advise.

## 2017-07-10 DIAGNOSIS — E0590 Thyrotoxicosis, unspecified without thyrotoxic crisis or storm: Secondary | ICD-10-CM | POA: Insufficient documentation

## 2017-08-04 ENCOUNTER — Encounter: Payer: Self-pay | Admitting: Family Medicine

## 2017-08-04 ENCOUNTER — Ambulatory Visit (INDEPENDENT_AMBULATORY_CARE_PROVIDER_SITE_OTHER): Payer: BLUE CROSS/BLUE SHIELD | Admitting: Family Medicine

## 2017-08-04 VITALS — BP 112/80 | HR 73 | Temp 98.7°F | Resp 17 | Ht 65.0 in | Wt 144.0 lb

## 2017-08-04 DIAGNOSIS — E0590 Thyrotoxicosis, unspecified without thyrotoxic crisis or storm: Secondary | ICD-10-CM | POA: Diagnosis not present

## 2017-08-04 DIAGNOSIS — R41840 Attention and concentration deficit: Secondary | ICD-10-CM | POA: Diagnosis not present

## 2017-08-04 DIAGNOSIS — Z720 Tobacco use: Secondary | ICD-10-CM

## 2017-08-04 NOTE — Progress Notes (Signed)
Chief Complaint  Patient presents with  . Follow-up    celexa, not working for pt. Per pt feels like it helped at first, then nervousness came back- experienced sob, rapid hr, fatigued and up more since being on pt medication than before    HPI   Pt reports that she tried Celexa for 3 weeks and started to notice rapid heart rate, shortness of breath, fatigue Her visit for Dr. Loanne [name] at [facility] showed that she has mild thryoid suppression She states that she is now on methimazole and was started while still on celexa. She states that she also started vaping around the same time. She feels like  Sometimes she is all over the place She is not longer on Celexa She still vapes and is still on methimazole and is still anxiety with some attention deficit issues She is concerned about her attention deficit and is worried it will affect her work performance    Past Medical History:  Diagnosis Date  . Anemia   . Anxiety   . Depression   . Fibroids     Current Outpatient Prescriptions  Medication Sig Dispense Refill  . methimazole (TAPAZOLE) 5 MG tablet Take 1 tablet (5 mg total) by mouth daily. 30 tablet 2  . Multiple Vitamin (MULTIVITAMIN) tablet Take 1 tablet by mouth daily.     No current facility-administered medications for this visit.     Allergies: No Known Allergies  Past Surgical History:  Procedure Laterality Date  . MYOMECTOMY    . RECTAL EXAMINATION UNDER ANESTHESIA W/ CYSTOSCOPY      Social History   Social History  . Marital status: Single    Spouse name: N/A  . Number of children: N/A  . Years of education: N/A   Social History Main Topics  . Smoking status: Former Smoker    Types: Cigarettes    Quit date: 11/06/2014  . Smokeless tobacco: Never Used  . Alcohol use No  . Drug use: No  . Sexual activity: Not Asked   Other Topics Concern  . None   Social History Narrative  . None    Family History  Problem Relation Age of Onset  .  Mental illness Father   . Hypertension Mother   . Thyroid disease Neg Hx      ROS Review of Systems See HPI Constitution: No fevers or chills No malaise No diaphoresis Skin: No rash or itching Eyes: no blurry vision, no double vision GU: no dysuria or hematuria Neuro: no dizziness or headaches  Objective: Vitals:   08/04/17 0905  BP: 112/80  Pulse: 73  Resp: 17  Temp: 98.7 F (37.1 C)  TempSrc: Oral  SpO2: 98%  Weight: 144 lb (65.3 kg)  Height: 5\' 5"  (1.651 m)    Physical Exam  Constitutional: She is oriented to person, place, and time. She appears well-developed and well-nourished.  HENT:  Head: Normocephalic and atraumatic.  Eyes: Conjunctivae and EOM are normal.  Cardiovascular: Normal rate, regular rhythm and normal heart sounds.   No murmur heard. Pulmonary/Chest: Effort normal and breath sounds normal. No respiratory distress. She has no wheezes.  Neurological: She is alert and oriented to person, place, and time.    Assessment and Plan Sabrina [name] was seen today for follow-up.  Diagnoses and all orders for this visit:  Hyperthyroidism- currently on Methimazole  Attention deficit- referred to Dr. Darleene [name] Discussed that she should get assessment before starting meds for anxiety  Smokeless tobacco use Pt advised to quit  vaping as it can lead to palpitations and dependence     Sabrina [name]

## 2017-08-04 NOTE — Patient Instructions (Addendum)
Dr. Darleene Cleaver  [phone]    IF you received an x-ray today, you will receive an invoice from [facility]. Please contact [facility] at [phone] with questions or concerns regarding your invoice.   IF you received labwork today, you will receive an invoice from [facility]. Please contact [facility] at [phone] with questions or concerns regarding your invoice.   Our billing staff will not be able to assist you with questions regarding bills from these companies.  You will be contacted with the lab results as soon as they are available. The fastest way to get your results is to activate your My Chart account. Instructions are located on the last page of this paperwork. If you have not heard from [place] regarding the results in 2 weeks, please contact this office.

## 2017-10-01 ENCOUNTER — Other Ambulatory Visit: Payer: Self-pay

## 2017-10-01 MED ORDER — METHIMAZOLE 5 MG PO TABS: 5.0000 mg | ORAL_TABLET | Freq: Every day | ORAL | 1 refills | Status: DC

## 2017-10-29 ENCOUNTER — Other Ambulatory Visit: Payer: Self-pay | Admitting: Family Medicine

## 2017-10-29 DIAGNOSIS — F411 Generalized anxiety disorder: Secondary | ICD-10-CM

## 2017-12-31 ENCOUNTER — Ambulatory Visit: Payer: BLUE CROSS/BLUE SHIELD | Admitting: Physician Assistant

## 2017-12-31 ENCOUNTER — Encounter: Payer: Self-pay | Admitting: Physician Assistant

## 2017-12-31 ENCOUNTER — Other Ambulatory Visit: Payer: Self-pay

## 2017-12-31 VITALS — BP 108/62 | HR 73 | Temp 98.3°F | Resp 18 | Ht 65.35 in | Wt 145.2 lb

## 2017-12-31 DIAGNOSIS — X503XXA Overexertion from repetitive movements, initial encounter: Secondary | ICD-10-CM

## 2017-12-31 DIAGNOSIS — T148XXA Other injury of unspecified body region, initial encounter: Secondary | ICD-10-CM

## 2017-12-31 DIAGNOSIS — M79645 Pain in left finger(s): Secondary | ICD-10-CM | POA: Diagnosis not present

## 2017-12-31 MED ORDER — NAPROXEN 500 MG PO TABS: 500.0000 mg | ORAL_TABLET | Freq: Two times a day (BID) | ORAL | 0 refills | Status: DC

## 2017-12-31 NOTE — Progress Notes (Signed)
   Sabrina [name]  MRN: [id number] DOB: 1975-11-28  Subjective:   Sabrina [name] is a 42 y.o. female who presents for evaluation of left pointer finger pain.  Onset was 3 days ago. The pain is moderate, worsens with pressing it against things, and is relieved by rest. There is no associated numbness, tingling, redness and warmth. Denies acute injury. Has been a massage therapist for 12 years, she is left hand dominant. Evaluation to date: none.  Has tried ice and advil with some relief.  Review of Systems  Constitutional: Negative for chills, diaphoresis and fever.    Patient Active Problem List   Diagnosis Date Noted  . Hyperthyroidism 07/10/2017  . Left anterior knee pain 08/07/2016  . Fibroids 10/13/2013    Current Outpatient Medications on File Prior to Visit  Medication Sig Dispense Refill  . methimazole (TAPAZOLE) 5 MG tablet Take 1 tablet (5 mg total) by mouth daily. 90 tablet 1  . Multiple Vitamin (MULTIVITAMIN) tablet Take 1 tablet by mouth daily.     No current facility-administered medications on file prior to visit.     No Known Allergies   Objective:  BP 108/62 (BP Location: Left Arm, Patient Position: Sitting, Cuff Size: Normal)   Pulse 73   Temp 98.3 F (36.8 C) (Oral)   Resp 18   Ht 5' 5.35" (1.66 m)   Wt 145 lb 3.2 oz (65.9 kg)   LMP 12/23/2017 (Approximate)   SpO2 98%   BMI 23.90 kg/m   Physical Exam  Constitutional: She is oriented to person, place, and time and well-developed, well-nourished, and in no distress.  HENT:  Head: Normocephalic and atraumatic.  Eyes: Conjunctivae are normal.  Neck: Normal range of motion.  Cardiovascular:  Pulses:      Radial pulses are 2+ on the right side, and 2+ on the left side.  Pulmonary/Chest: Effort normal.  Musculoskeletal:       Left hand: She exhibits tenderness. She exhibits normal range of motion, no bony tenderness, normal capillary refill and no swelling. Normal sensation noted. Normal strength  noted.       Hands: Neurological: She is alert and oriented to person, place, and time. Gait normal.  Skin: Skin is warm and dry.  Psychiatric: Affect normal.  Vitals reviewed.    Left index finger buddy taped to middle finger.   Assessment and Plan :  1. Finger pain, left 2. Overuse injury History and physical exam findings consistent with overuse injury.  Concern for ligament strain.  Buddy tape applied.  Recommended rest, ice, and anti-inflammatories as needed for pain.  No bony tenderness.  She is neurovascularly intact.  Advised to return to clinic if symptoms worsen, do not improve in 2 weeks, or as needed.  If no improvement, consider x-ray at that time.  Tenna Delaine PA-C  [facility] 12/31/2017 4:05 PM

## 2017-12-31 NOTE — Patient Instructions (Addendum)
I recommend keeping finger buddy taped to adjacent finger until pain resolves (this can take up to 2 weeks). Advance activites with finger as tolerated.  Apply ice to affected area 4-5 x a day for 20 minutes at a time. Take naproxen for inflammation. If no improvement or symptoms worsen, please return.  Finger Sprain A finger sprain happens when the bands of tissue (ligaments) that hold the finger bones together stretch too much or tear. Follow these instructions at home: If you have a splint:  Wear the splint as told by your doctor. Remove it only as told by your doctor.  Loosen the splint if your fingers tingle, get numb, or turn cold and blue.  Do not let your splint get wet if it is not waterproof.  Keep the splint clean. If you have a cast:  Do not stick anything inside the cast to scratch your skin.  Check the skin around the cast every day. Tell your doctor about any concerns.  You may put lotion on dry skin around the edges of the cast. Do not put lotion on the skin under the cast.  Do not let your cast get wet if it is not waterproof.  Keep the cast clean. Bathing  If your splint or cast is not waterproof, cover it with a watertight plastic bag when you take a bath or a shower.  Keep any bandages (dressings) dry until your doctor says they can be taken off. Managing pain, stiffness, and swelling  If directed, put ice on the injured area: ? Put ice in a plastic bag. ? Place a towel between your skin and the bag. ? Leave the ice on for 20 minutes, 2-3 times a day.  Move your fingers often to avoid stiffness and to lessen swelling.  Raise (elevate) the injured area above the level of your heart while you are sitting or lying down. General instructions  Do not put pressure on any part of the cast or splint until it is fully hardened. This may take many hours.  Take over-the-counter and prescription medicines only as told by your doctor.  Do not drive or use heavy  machinery while taking prescription pain medicine.  Do exercises as told by your doctor or physical therapist.  Do not wear rings on your injured finger.  Keep all follow-up visits as told by your doctor. This is important. Contact a doctor if:  Your pain is not helped by medicine.  Your bruising or swelling gets worse.  Your cast or splint is damaged.  Your finger is numb or blue.  Your finger feels colder than normal. This information is not intended to replace advice given to you by your health care provider. Make sure you discuss any questions you have with your health care provider. Document Released: 10/31/2010 Document Revised: 10/29/2015 Document Reviewed: 08/08/2015 Elsevier Interactive Patient Education  2018 [employer].    IF you received an x-ray today, you will receive an invoice from [facility]. Please contact [facility] at [phone] with questions or concerns regarding your invoice.   IF you received labwork today, you will receive an invoice from [facility]. Please contact [facility] at [phone] with questions or concerns regarding your invoice.   Our billing staff will not be able to assist you with questions regarding bills from these companies.  You will be contacted with the lab results as soon as they are available. The fastest way to get your results is to activate your My Chart account. Instructions  are located on the last page of this paperwork. If you have not heard from [place] regarding the results in 2 weeks, please contact this office.

## 2018-01-05 ENCOUNTER — Telehealth: Payer: Self-pay

## 2018-01-05 NOTE — Telephone Encounter (Signed)
Please call patient.  I have created a work note from her last visit.  If I am remembering correctly she had to return to work yesterday, 01/04/18, Which is when I recommended she go back to work. You should have access to print this letter and fax it to the number provided.  Thank you so much.

## 2018-01-05 NOTE — Telephone Encounter (Signed)
Copied from [place]. Topic: Inquiry >> Jan 04, 2018  3:11 PM Scherrie Gerlach wrote: Reason for CRM: pt would like the copy of her work note faxed today if possible to Fax: [phone]  From December 31, 2017  >> Jan 04, 2018  3:20 PM Karene Fry P wrote: Please route accordingly, this is not a patient at [facility].

## 2018-01-06 NOTE — Telephone Encounter (Signed)
Faxed letter and advised pt. Pt states that she is feeling better.

## 2018-01-13 ENCOUNTER — Ambulatory Visit: Payer: BLUE CROSS/BLUE SHIELD | Admitting: Physician Assistant

## 2018-01-13 ENCOUNTER — Ambulatory Visit: Payer: Self-pay | Admitting: Physician Assistant

## 2018-01-13 VITALS — BP 107/65 | HR 65 | Temp 97.8°F | Resp 16 | Ht 65.0 in | Wt 145.0 lb

## 2018-01-13 DIAGNOSIS — R070 Pain in throat: Secondary | ICD-10-CM

## 2018-01-13 DIAGNOSIS — R05 Cough: Secondary | ICD-10-CM

## 2018-01-13 DIAGNOSIS — R059 Cough, unspecified: Secondary | ICD-10-CM

## 2018-01-13 DIAGNOSIS — J101 Influenza due to other identified influenza virus with other respiratory manifestations: Secondary | ICD-10-CM | POA: Diagnosis not present

## 2018-01-13 DIAGNOSIS — R6889 Other general symptoms and signs: Secondary | ICD-10-CM | POA: Diagnosis not present

## 2018-01-13 LAB — POC INFLUENZA A&B (BINAX/QUICKVUE)
INFLUENZA B, POC: NEGATIVE
Influenza A, POC: POSITIVE — AB

## 2018-01-13 LAB — POCT RAPID STREP A (OFFICE): Rapid Strep A Screen: NEGATIVE

## 2018-01-13 MED ORDER — GUAIFENESIN ER 1200 MG PO TB12: 1.0000 | ORAL_TABLET | Freq: Two times a day (BID) | ORAL | 1 refills | Status: DC | PRN

## 2018-01-13 MED ORDER — OSELTAMIVIR PHOSPHATE 75 MG PO CAPS: 75.0000 mg | ORAL_CAPSULE | Freq: Two times a day (BID) | ORAL | 0 refills | Status: DC

## 2018-01-13 MED ORDER — HYDROCOD POLST-CPM POLST ER 10-8 MG/5ML PO SUER: 5.0000 mL | Freq: Every evening | ORAL | 0 refills | Status: DC | PRN

## 2018-01-13 NOTE — Progress Notes (Signed)
PRIMARY CARE AT [facility] [address] [place] [address] [phone]  Date:  01/13/2018   Name:  Sabrina [name]   DOB:  Aug 15, 1976   MRN:  [id number]  PCP:  Default, Provider, MD    History of Present Illness:  Sabrina [name] is a 42 y.o. female patient who presents to PCP with  Chief Complaint  Patient presents with  . Chills    x 2 days  . Cough    x 2 days  . Sore Throat    x 2 days     2 days of sore throat, coughing, chills and sweats.  She has some bodyaches.  Her throat is very sore, and burning sensation.  It is mainly a dry cough.  No ear discomfort.  No nasal congestion or runny nose.  No abdominal pain or nausea.  General malaise.   She took advil, with last administration last night (>10 hours ago) She is currently not on her methimazole Patient Active Problem List   Diagnosis Date Noted  . Hyperthyroidism 07/10/2017  . Left anterior knee pain 08/07/2016  . Fibroids 10/13/2013    Past Medical History:  Diagnosis Date  . Anemia   . Anxiety   . Depression   . Fibroids     Past Surgical History:  Procedure Laterality Date  . MYOMECTOMY    . RECTAL EXAMINATION UNDER ANESTHESIA W/ CYSTOSCOPY      Social History   Tobacco Use  . Smoking status: Former Smoker    Types: Cigarettes    Last attempt to quit: 11/06/2014    Years since quitting: 3.1  . Smokeless tobacco: Never Used  Substance Use Topics  . Alcohol use: No    Alcohol/week: 0.0 oz  . Drug use: No    Family History  Problem Relation Age of Onset  . Mental illness Father   . Cancer Father   . Hypertension Mother   . Thyroid disease Neg Hx     No Known Allergies  Medication list has been reviewed and updated.  Current Outpatient Medications on File Prior to Visit  Medication Sig Dispense Refill  . Multiple Vitamin (MULTIVITAMIN) tablet Take 1 tablet by mouth daily.    . methimazole (TAPAZOLE) 5 MG tablet Take 1 tablet (5 mg total) by mouth daily. (Patient not taking: Reported  on 01/13/2018) 90 tablet 1  . naproxen (NAPROSYN) 500 MG tablet Take 1 tablet (500 mg total) by mouth 2 (two) times daily with a meal. (Patient not taking: Reported on 01/13/2018) 30 tablet 0   No current facility-administered medications on file prior to visit.     ROS ROS otherwise unremarkable unless listed above.  Physical Examination: BP 107/65   Pulse 65   Temp 97.8 F (36.6 C) (Oral)   Resp 16   Ht 5\' 5"  (1.651 m)   Wt 145 lb (65.8 kg)   LMP 12/23/2017 (Approximate)   SpO2 98%   BMI 24.13 kg/m  Ideal Body Weight: Weight in (lb) to have BMI = 25: 149.9  Physical Exam  Constitutional: She is oriented to person, place, and time. She appears well-developed and well-nourished. No distress.  HENT:  Head: Normocephalic and atraumatic.  Right Ear: External ear normal.  Left Ear: External ear normal.  Eyes: Pupils are equal, round, and reactive to light. Conjunctivae and EOM are normal.  Cardiovascular: Normal rate.  Pulmonary/Chest: Effort normal and breath sounds normal. No apnea. No respiratory distress. She has no decreased breath sounds.  Neurological: She  is alert and oriented to person, place, and time.  Skin: She is not diaphoretic.  Psychiatric: She has a normal mood and affect. Her behavior is normal.    Results for orders placed or performed in visit on 01/13/18  POCT rapid strep A  Result Value Ref Range   Rapid Strep A Screen Negative Negative  POC Influenza A&B(BINAX/QUICKVUE)  Result Value Ref Range   Influenza A, POC Positive (A) Negative   Influenza B, POC Negative Negative     Assessment and Plan: Sabrina [name] is a 42 y.o. female who is here today for cc of  Chief Complaint  Patient presents with  . Chills    x 2 days  . Cough    x 2 days  . Sore Throat    x 2 days  will treat at this time. Advised to follow up with endocrinologist.  She voiced verbal understanding.  Influenza A - Plan: oseltamivir (TAMIFLU) 75 MG capsule, Guaifenesin  (MUCINEX MAXIMUM STRENGTH) 1200 MG TB12, chlorpheniramine-HYDROcodone (TUSSIONEX PENNKINETIC ER) 10-8 MG/5ML SUER  Throat pain - Plan: POCT rapid strep A, POC Influenza A&B(BINAX/QUICKVUE)  Cough - Plan: POCT rapid strep A, POC Influenza A&B(BINAX/QUICKVUE), chlorpheniramine-HYDROcodone (TUSSIONEX PENNKINETIC ER) 10-8 MG/5ML SUER  Flu-like symptoms - Plan: POC Influenza A&B(BINAX/QUICKVUE)  Ivar Drape, PA-C [facility] 4/9/201911:51 AM

## 2018-01-13 NOTE — Patient Instructions (Addendum)
Influenza, Adult Influenza ("the flu") is an infection in the lungs, nose, and throat (respiratory tract). It is caused by a virus. The flu causes many common cold symptoms, as well as a high fever and body aches. It can make you feel very sick. The flu spreads easily from person to person (is contagious). Getting a flu shot (influenza vaccination) every year is the best way to prevent the flu. Follow these instructions at home:  Take over-the-counter and prescription medicines only as told by your doctor.  Use a cool mist humidifier to add moisture (humidity) to the air in your home. This can make it easier to breathe.  Rest as needed.  Drink enough fluid to keep your pee (urine) clear or pale yellow.  Cover your mouth and nose when you cough or sneeze.  Wash your hands with soap and water often, especially after you cough or sneeze. If you cannot use soap and water, use hand sanitizer.  Stay home from work or school as told by your doctor. Unless you are visiting your doctor, try to avoid leaving home until your fever has been gone for 24 hours without the use of medicine.  Keep all follow-up visits as told by your doctor. This is important. How is this prevented?  Getting a yearly (annual) flu shot is the best way to avoid getting the flu. You may get the flu shot in late summer, fall, or winter. Ask your doctor when you should get your flu shot.  Wash your hands often or use hand sanitizer often.  Avoid contact with people who are sick during cold and flu season.  Eat healthy foods.  Drink plenty of fluids.  Get enough sleep.  Exercise regularly. Contact a doctor if:  You get new symptoms.  You have: ? Chest pain. ? Watery poop (diarrhea). ? A fever.  Your cough gets worse.  You start to have more mucus.  You feel sick to your stomach (nauseous).  You throw up (vomit). Get help right away if:  You start to be short of breath or have trouble breathing.  Your  skin or nails turn a bluish color.  You have very bad pain or stiffness in your neck.  You get a sudden headache.  You get sudden pain in your face or ear.  You cannot stop throwing up. This information is not intended to replace advice given to you by your health care provider. Make sure you discuss any questions you have with your health care provider. Document Released: 07/07/2008 Document Revised: 03/05/2016 Document Reviewed: 07/23/2015 Elsevier Interactive Patient Education  2017 [employer].     IF you received an x-ray today, you will receive an invoice from [facility]. Please contact [facility] at [phone] with questions or concerns regarding your invoice.   IF you received labwork today, you will receive an invoice from [facility]. Please contact [facility] at [phone] with questions or concerns regarding your invoice.   Our billing staff will not be able to assist you with questions regarding bills from these companies.  You will be contacted with the lab results as soon as they are available. The fastest way to get your results is to activate your My Chart account. Instructions are located on the last page of this paperwork. If you have not heard from [place] regarding the results in 2 weeks, please contact this office.

## 2018-01-18 ENCOUNTER — Encounter: Payer: Self-pay | Admitting: Physician Assistant

## 2018-01-19 ENCOUNTER — Encounter: Payer: Self-pay | Admitting: Physician Assistant

## 2018-01-19 ENCOUNTER — Ambulatory Visit: Payer: BLUE CROSS/BLUE SHIELD | Admitting: Physician Assistant

## 2018-01-19 VITALS — BP 110/74 | HR 85 | Temp 98.6°F | Resp 17 | Ht 64.0 in | Wt 140.0 lb

## 2018-01-19 DIAGNOSIS — G933 Postviral fatigue syndrome: Secondary | ICD-10-CM

## 2018-01-19 DIAGNOSIS — G9331 Postviral fatigue syndrome: Secondary | ICD-10-CM

## 2018-01-19 MED ORDER — NAPROXEN 500 MG PO TABS: 500.0000 mg | ORAL_TABLET | Freq: Two times a day (BID) | ORAL | 0 refills | Status: DC

## 2018-01-19 NOTE — Progress Notes (Signed)
01/19/2018 3:25 PM   DOB: [birth year] / MRN: [id number]  SUBJECTIVE:  Sabrina [name] is a 42 y.o. female presenting for cough, fatigue, nasal congestion.  Patient was just diagnosed with influenza A roughly 1 week ago and continues to have significant fatigue.  States she tried to go back to work on Monday but was unable to secondary to fatigue.  She did not receive the annual flu vaccination.  She has No Known Allergies.   She  has a past medical history of Anemia, Anxiety, Depression, and Fibroids.    She  reports that she quit smoking about 3 years ago. Her smoking use included cigarettes. She has never used smokeless tobacco. She reports that she does not drink alcohol or use drugs. She  reports that she currently engages in sexual activity. The patient  has a past surgical history that includes Rectal examination under anesthesia w/ cystoscopy and Myomectomy.  Her family history includes Cancer in her father; Hypertension in her mother; Mental illness in her father.  Review of Systems  Constitutional: Negative for chills, diaphoresis and fever.  Respiratory: Positive for cough. Negative for shortness of breath.   Cardiovascular: Negative for chest pain, orthopnea and leg swelling.  Gastrointestinal: Negative for nausea.  Skin: Negative for rash.  Neurological: Negative for dizziness.    The problem list and medications were reviewed and updated by myself where necessary and exist elsewhere in the encounter.   OBJECTIVE:  BP 110/74   Pulse 85   Temp 98.6 F (37 C) (Oral)   Resp 17   Ht 5\' 4"  (1.626 m)   Wt 140 lb (63.5 kg)   LMP 12/23/2017 (Approximate)   SpO2 98%   BMI 24.03 kg/m   Wt Readings from Last 3 Encounters:  01/19/18 140 lb (63.5 kg)  01/13/18 145 lb (65.8 kg)  12/31/17 145 lb 3.2 oz (65.9 kg)   Temp Readings from Last 3 Encounters:  01/19/18 98.6 F (37 C) (Oral)  01/13/18 97.8 F (36.6 C) (Oral)  12/31/17 98.3 F (36.8 C) (Oral)   BP Readings  from Last 3 Encounters:  01/19/18 110/74  01/13/18 107/65  12/31/17 108/62   Pulse Readings from Last 3 Encounters:  01/19/18 85  01/13/18 65  12/31/17 73     Physical Exam  Constitutional: She is active.  Non-toxic appearance.  Cardiovascular: Normal rate, regular rhythm, S1 normal, S2 normal, normal heart sounds and intact distal pulses. Exam reveals no gallop, no friction rub and no decreased pulses.  No murmur heard. Pulmonary/Chest: Effort normal. No stridor. No tachypnea. No respiratory distress. She has no wheezes. She has no rales.  Abdominal: She exhibits no distension.  Musculoskeletal: She exhibits no edema.  Neurological: She is alert.  Skin: Skin is warm and dry. She is not diaphoretic. No pallor.    No results found for this or any previous visit (from the past 72 hour(s)).  No results found.  ASSESSMENT AND PLAN:  Leroy was seen today for follow-up.  Diagnoses and all orders for this visit:  Post viral syndrome: Normal exam and normal vital signs.  I do not think there is a new etiology at play at this time.  This is most likely secondary to influenza A and now she has some protracted symptoms.  She did not receive the annual flu shot.  I am starting her on naproxen and advising that she take this every 12 hours for the next 4-5 days.  Advise she can go back to  work on Friday. -     naproxen (NAPROSYN) 500 MG tablet; Take 1 tablet (500 mg total) by mouth 2 (two) times daily with a meal.    The patient is advised to call or return to clinic if she does not see an improvement in symptoms, or to seek the care of the closest emergency department if she worsens with the above plan.   Philis Fendt, MHS, PA-C [facility] 01/19/2018 3:25 PM

## 2018-01-19 NOTE — Patient Instructions (Addendum)
Please take the naproxen twice a day for the next 4-5 days to help combat some of this fatigue that you are having.  You could consider also picking up some Zyrtec-D5 120 at the pharmacy and taking this in the morning.  This would help with energy and also decrease nasal congestion.  I think you should be okay to go back to work on Friday.    IF you received an x-ray today, you will receive an invoice from [facility]. Please contact [facility] at [phone] with questions or concerns regarding your invoice.   IF you received labwork today, you will receive an invoice from [facility]. Please contact [facility] at [phone] with questions or concerns regarding your invoice.   Our billing staff will not be able to assist you with questions regarding bills from these companies.  You will be contacted with the lab results as soon as they are available. The fastest way to get your results is to activate your My Chart account. Instructions are located on the last page of this paperwork. If you have not heard from [place] regarding the results in 2 weeks, please contact this office.

## 2018-03-08 ENCOUNTER — Telehealth: Payer: Self-pay | Admitting: Endocrinology

## 2018-03-08 NOTE — Telephone Encounter (Signed)
Patient stated she is wanting to see if she can schedule to have a biopsy done of her Goiter and stated that she has not heard from anyone about this.  She also would like to know how much this could cost without insurance.   Please advise

## 2018-03-08 NOTE — Telephone Encounter (Signed)
Pt was offered options for hyperthyroidism, and chose tapazole.  She did not return for f/u

## 2018-03-08 NOTE — Telephone Encounter (Signed)
Should this be ordered, please advise?

## 2018-03-09 NOTE — Telephone Encounter (Signed)
LVM that patient did not come to last f/u appointment & needed to do so first.

## 2018-04-07 ENCOUNTER — Ambulatory Visit: Payer: Self-pay | Admitting: *Deleted

## 2018-04-07 ENCOUNTER — Encounter: Payer: Self-pay | Admitting: Physician Assistant

## 2018-04-07 ENCOUNTER — Ambulatory Visit: Payer: BLUE CROSS/BLUE SHIELD | Admitting: Physician Assistant

## 2018-04-07 ENCOUNTER — Other Ambulatory Visit: Payer: Self-pay

## 2018-04-07 VITALS — BP 122/64 | HR 100 | Temp 98.0°F | Resp 18 | Ht 64.0 in | Wt 146.6 lb

## 2018-04-07 DIAGNOSIS — T192XXA Foreign body in vulva and vagina, initial encounter: Secondary | ICD-10-CM | POA: Diagnosis not present

## 2018-04-07 DIAGNOSIS — Z3009 Encounter for other general counseling and advice on contraception: Secondary | ICD-10-CM

## 2018-04-07 NOTE — Patient Instructions (Signed)
     IF you received an x-ray today, you will receive an invoice from [facility]. Please contact [facility] at [phone] with questions or concerns regarding your invoice.   IF you received labwork today, you will receive an invoice from [facility]. Please contact [facility] at [phone] with questions or concerns regarding your invoice.   Our billing staff will not be able to assist you with questions regarding bills from these companies.  You will be contacted with the lab results as soon as they are available. The fastest way to get your results is to activate your My Chart account. Instructions are located on the last page of this paperwork. If you have not heard from us regarding the results in 2 weeks, please contact this office.     

## 2018-04-07 NOTE — Progress Notes (Signed)
Sabrina [name]  MRN: [id number] DOB: 06-20-1976  PCP: Default, Provider, MD  Chief Complaint  Patient presents with  . Gynecologic Exam    lost a blue tube top     Subjective:  Pt presents to clinic for for concerns that she has a foreign body in her vagina.  She was using spermicide last evening and she does not think she took off the top, she cannot find it.  She is also interested in something for birth control as she does not want to get pregnant for at least 1-2 years.  History is obtained by patient.  Review of Systems  Constitutional: Negative for chills and fever.  Genitourinary: Negative for vaginal bleeding and vaginal discharge.    Patient Active Problem List   Diagnosis Date Noted  . Hyperthyroidism 07/10/2017  . Left anterior knee pain 08/07/2016  . Fibroids 10/13/2013    Current Outpatient Medications on File Prior to Visit  Medication Sig Dispense Refill  . citalopram (CELEXA) 10 MG tablet Take 10 mg by mouth daily.    . methimazole (TAPAZOLE) 5 MG tablet   0  . Multiple Vitamin (MULTIVITAMIN) tablet Take 1 tablet by mouth daily.     No current facility-administered medications on file prior to visit.     No Known Allergies  Past Medical History:  Diagnosis Date  . Anemia   . Anxiety   . Depression   . Fibroids    Social History   Social History Narrative   Massage therapist   Social History   Tobacco Use  . Smoking status: Current Every Day Smoker    Types: Cigarettes    Last attempt to quit: 11/06/2014    Years since quitting: 3.4  . Smokeless tobacco: Never Used  Substance Use Topics  . Alcohol use: No    Alcohol/week: 0.0 oz  . Drug use: No   family history includes Cancer in her father; Hypertension in her mother; Mental illness in her father.     Objective:  BP 122/64   Pulse 100   Temp 98 F (36.7 C) (Oral)   Resp 18   Ht 5\' 4"  (1.626 m)   Wt 146 lb 9.6 oz (66.5 kg)   LMP 03/31/2018   SpO2 99%   BMI 25.16 kg/m   Body mass index is 25.16 kg/m.  Wt Readings from Last 3 Encounters:  04/07/18 146 lb 9.6 oz (66.5 kg)  01/19/18 140 lb (63.5 kg)  01/13/18 145 lb (65.8 kg)    Physical Exam  Constitutional: She is oriented to person, place, and time. She appears well-developed and well-nourished.  HENT:  Head: Normocephalic and atraumatic.  Right Ear: Hearing and external ear normal.  Left Ear: Hearing and external ear normal.  Eyes: Conjunctivae are normal.  Neck: Normal range of motion.  Cardiovascular: Normal rate, regular rhythm and normal heart sounds.  No murmur heard. Pulmonary/Chest: Effort normal and breath sounds normal.  Genitourinary: Pelvic exam was performed with patient supine.  Genitourinary Comments: Vaginal canal - no FB seen  Musculoskeletal:       Right lower leg: She exhibits no edema.       Left lower leg: She exhibits no edema.  Neurological: She is alert and oriented to person, place, and time.  Skin: Skin is warm and dry.  Psychiatric: She has a normal mood and affect. Her behavior is normal. Judgment and thought content normal.  Vitals reviewed.   Assessment and Plan :  Foreign body in vagina,  initial encounter  Birth control counseling - d/w pt that timing to start any type of birth control is at next menses - she will RTC in 2 weeks for discussion of options for birth control.  Patient verbalized to me that they understand the following: diagnosis, what is being done for them, what to expect and what should be done at home.  Their questions have been answered.  See after visit summary for patient specific instructions.  Windell Hummingbird PA-C  [facility] [place] [facility] 04/07/2018 5:56 PM  Please note: Portions of this report may have been transcribed using dragon voice recognition software. Every effort was made to ensure accuracy; however, inadvertent computerized transcription errors may be present.

## 2018-04-07 NOTE — Telephone Encounter (Signed)
Pt called because she thinks the cap of the spermicidei is in her vaginal. The cap is about 2 inches long. She does not know this for sure.  She has checked the floor but not in the bed yet. She is wondering if there is anything she can do other than making an office visit.  She has been advised to see a provider so they can check to see if any foreign object is there. Appointment made. Pt would like a call back if there is something else that can be done without seeing a provider. Flow at [facility] [place] notified, advised pt to keep the appointment.  Pt advised to call back for fever, abd pain, vaginal bleeding. Pt voiced understanding.  Reason for Disposition . [1] Vaginal FB AND [2] can't remove at home  Answer Assessment - Initial Assessment Questions 1. OBJECT: "What do you think it is?"      Cap off of spermicide  2. SIZE: "How large is it?"      Almost 2 inches long 3. PAIN: "Are you having any pain?" If so, ask: "How bad is it?"  (Scale 1-10; or mild, moderate, severe)     no 4. ONSET: "How long do you think the foreign body has been there?"      Since last night 5. MECHANISM: "Tell me how it happened."     Sexual intercourse 6. OTHER SYMPTOMS: "Do you have any other symptoms?" (e.g., vaginal discharge, fever, rash)     no 7. PREGNANCY: "Is there any chance you are pregnant?" "When was your last menstrual period?"     no  Protocols used: VAGINAL FOREIGN BODY-A-AH

## 2018-04-11 ENCOUNTER — Ambulatory Visit: Payer: Self-pay | Admitting: Family Medicine

## 2018-04-11 ENCOUNTER — Ambulatory Visit: Payer: BLUE CROSS/BLUE SHIELD | Admitting: Endocrinology

## 2018-04-11 ENCOUNTER — Encounter: Payer: Self-pay | Admitting: Endocrinology

## 2018-04-11 VITALS — BP 98/60 | HR 60 | Wt 150.0 lb

## 2018-04-11 DIAGNOSIS — E0590 Thyrotoxicosis, unspecified without thyrotoxic crisis or storm: Secondary | ICD-10-CM

## 2018-04-11 LAB — T4, FREE: FREE T4: 0.81 ng/dL (ref 0.60–1.60)

## 2018-04-11 LAB — TSH: TSH: 0.75 u[IU]/mL (ref 0.35–4.50)

## 2018-04-11 NOTE — Progress Notes (Signed)
Subjective:    Patient ID: Sabrina [name], female    DOB: Sep 03, 1976, 42 y.o.   MRN: [id number]  HPI Pt returns for f/u of hyperthyroidism (dx'ed 2018; she has never had thyroid imaging, but PE shows right thyroid mass; she says she is not at risk for pregnancy; she has sxs despite mild TSH abnormality, so tapazole was rx'ed).  She last took tapazole 4 mos ago.   pt states she feels well in general. Past Medical History:  Diagnosis Date  . Anemia   . Anxiety   . Depression   . Fibroids     Past Surgical History:  Procedure Laterality Date  . MYOMECTOMY    . RECTAL EXAMINATION UNDER ANESTHESIA W/ CYSTOSCOPY      Social History   Socioeconomic History  . Marital status: Single    Spouse name: Not on file  . Number of children: 0  . Years of education: Not on file  . Highest education level: Not on file  Occupational History  . Not on file  Social Needs  . Financial resource strain: Not on file  . Food insecurity:    Worry: Not on file    Inability: Not on file  . Transportation needs:    Medical: Not on file    Non-medical: Not on file  Tobacco Use  . Smoking status: Current Every Day Smoker    Types: Cigarettes    Last attempt to quit: 11/06/2014    Years since quitting: 3.4  . Smokeless tobacco: Never Used  Substance and Sexual Activity  . Alcohol use: No    Alcohol/week: 0.0 oz  . Drug use: No  . Sexual activity: Yes  Lifestyle  . Physical activity:    Days per week: Not on file    Minutes per session: Not on file  . Stress: Not on file  Relationships  . Social connections:    Talks on phone: Not on file    Gets together: Not on file    Attends religious service: Not on file    Active member of club or organization: Not on file    Attends meetings of clubs or organizations: Not on file    Relationship status: Not on file  . Intimate partner violence:    Fear of current or ex partner: Not on file    Emotionally abused: Not on file    Physically  abused: Not on file    Forced sexual activity: Not on file  Other Topics Concern  . Not on file  Social History Narrative   Massage therapist    Current Outpatient Medications on File Prior to Visit  Medication Sig Dispense Refill  . citalopram (CELEXA) 10 MG tablet Take 10 mg by mouth daily.    . methimazole (TAPAZOLE) 5 MG tablet   0  . Multiple Vitamin (MULTIVITAMIN) tablet Take 1 tablet by mouth daily.     No current facility-administered medications on file prior to visit.     No Known Allergies  Family History  Problem Relation Age of Onset  . Mental illness Father   . Cancer Father   . Hypertension Mother   . Thyroid disease Neg Hx     BP 98/60 (BP Location: Left Arm, Patient Position: Sitting, Cuff Size: Normal)   Pulse 60   Wt 150 lb (68 kg)   LMP 03/31/2018   SpO2 98%   BMI 25.75 kg/m   Review of Systems Denies tremor    Objective:  Physical Exam VITAL SIGNS:  See vs page GENERAL: no distress NECK: 4 cm right thyroid nodule is again noted   Lab Results  Component Value Date   TSH 0.75 04/11/2018      Assessment & Plan:  Thyroid mass, unchanged Hyperthyroidism: normal TFT now are prob due to lingering effect of tapazole.    Patient Instructions  blood tests are requested for you today.  We'll let you know about the results.   If it is overactive again, let's check a thyroid "scan" (a special, but easy and painless type of thyroid x ray).  It works like this: you go to the x-ray department of the hospital to swallow a pill, which contains a miniscule amount of radiation.  You will not notice any symptoms from this.  You will go back to the x-ray department the next day, to lie down in front of a camera.

## 2018-04-11 NOTE — Patient Instructions (Signed)
blood tests are requested for you today.  We'll let you know about the results.   If it is overactive again, let's check a thyroid "scan" (a special, but easy and painless type of thyroid x ray).  It works like this: you go to the x-ray department of the hospital to swallow a pill, which contains a miniscule amount of radiation.  You will not notice any symptoms from this.  You will go back to the x-ray department the next day, to lie down in front of a camera.

## 2018-04-25 ENCOUNTER — Ambulatory Visit (INDEPENDENT_AMBULATORY_CARE_PROVIDER_SITE_OTHER): Payer: BLUE CROSS/BLUE SHIELD | Admitting: Family Medicine

## 2018-04-25 ENCOUNTER — Encounter: Payer: Self-pay | Admitting: Family Medicine

## 2018-04-25 VITALS — BP 100/60 | HR 66 | Temp 98.6°F | Resp 16 | Ht 64.0 in | Wt 147.2 lb

## 2018-04-25 DIAGNOSIS — F17210 Nicotine dependence, cigarettes, uncomplicated: Secondary | ICD-10-CM | POA: Diagnosis not present

## 2018-04-25 DIAGNOSIS — Z30016 Encounter for initial prescription of transdermal patch hormonal contraceptive device: Secondary | ICD-10-CM | POA: Diagnosis not present

## 2018-04-25 DIAGNOSIS — N943 Premenstrual tension syndrome: Secondary | ICD-10-CM | POA: Diagnosis not present

## 2018-04-25 LAB — POCT URINE PREGNANCY: Preg Test, Ur: NEGATIVE

## 2018-04-25 MED ORDER — NORELGESTROMIN-ETH ESTRADIOL 150-35 MCG/24HR TD PTWK: 1.0000 | MEDICATED_PATCH | TRANSDERMAL | 12 refills | Status: DC

## 2018-04-25 NOTE — Patient Instructions (Addendum)
Sunday start  Apply your first patch on the first Sunday after your menstrual period begins. Use a non-hormonal contraceptive method of birth control, such as a condom and spermicide or diaphragm and spermicide, for the first 7 days of your first cycle only. If your period starts on Sunday, apply your first patch that day, and no backup birth control is needed.      IF you received an x-ray today, you will receive an invoice from [facility]. Please contact [facility] at [phone] with questions or concerns regarding your invoice.   IF you received labwork today, you will receive an invoice from [place]. Please contact [facility] at [phone] with questions or concerns regarding your invoice.   Our billing staff will not be able to assist you with questions regarding bills from these companies.  You will be contacted with the lab results as soon as they are available. The fastest way to get your results is to activate your My Chart account. Instructions are located on the last page of this paperwork. If you have not heard from [place] regarding the results in 2 weeks, please contact this office.

## 2018-04-25 NOTE — Progress Notes (Signed)
Chief Complaint  Patient presents with  . Contraception    HPI   She was on ocps 10 years ago  She reports that she had fibroids in the uterus  She states that she does not want to take pills  No liver disease, migraine, she denies history of breast cancer She smokes 5 cigarettes and is planning on cutting down     Past Medical History:  Diagnosis Date  . Anemia   . Anxiety   . Depression   . Fibroids     Current Outpatient Medications  Medication Sig Dispense Refill  . Multiple Vitamin (MULTIVITAMIN) tablet Take 1 tablet by mouth daily.    . citalopram (CELEXA) 10 MG tablet Take 10 mg by mouth daily.    . methimazole (TAPAZOLE) 5 MG tablet   0  . norelgestromin-ethinyl estradiol (ORTHO EVRA) 150-35 MCG/24HR transdermal patch Place 1 patch onto the skin once a week. 3 patch 12   No current facility-administered medications for this visit.     Allergies: No Known Allergies  Past Surgical History:  Procedure Laterality Date  . MYOMECTOMY    . RECTAL EXAMINATION UNDER ANESTHESIA W/ CYSTOSCOPY      Social History   Socioeconomic History  . Marital status: Single    Spouse name: Not on file  . Number of children: 0  . Years of education: Not on file  . Highest education level: Not on file  Occupational History  . Not on file  Social Needs  . Financial resource strain: Not on file  . Food insecurity:    Worry: Not on file    Inability: Not on file  . Transportation needs:    Medical: Not on file    Non-medical: Not on file  Tobacco Use  . Smoking status: Current Every Day Smoker    Types: Cigarettes    Last attempt to quit: 11/06/2014    Years since quitting: 3.4  . Smokeless tobacco: Never Used  Substance and Sexual Activity  . Alcohol use: No    Alcohol/week: 0.0 oz  . Drug use: No  . Sexual activity: Yes  Lifestyle  . Physical activity:    Days per week: Not on file    Minutes per session: Not on file  . Stress: Not on file  Relationships  .  Social connections:    Talks on phone: Not on file    Gets together: Not on file    Attends religious service: Not on file    Active member of club or organization: Not on file    Attends meetings of clubs or organizations: Not on file    Relationship status: Not on file  Other Topics Concern  . Not on file  Social History Narrative   Massage therapist    Family History  Problem Relation Age of Onset  . Mental illness Father   . Cancer Father   . Hypertension Mother   . Thyroid disease Neg Hx      ROS Review of Systems See HPI Constitution: No fevers or chills No malaise No diaphoresis Skin: No rash or itching Eyes: no blurry vision, no double vision GU: no dysuria or hematuria Neuro: no dizziness or headaches  all others reviewed and negative   Objective: Vitals:   04/25/18 1134  BP: 100/60  Pulse: 66  Resp: 16  Temp: 98.6 F (37 C)  TempSrc: Oral  SpO2: 99%  Weight: 147 lb 3.2 oz (66.8 kg)  Height: 5\' 4"  (1.626 m)  Physical Exam  Constitutional: She is oriented to person, place, and time. She appears well-developed and well-nourished.  HENT:  Head: Normocephalic and atraumatic.  Eyes: Conjunctivae and EOM are normal.  Cardiovascular: Normal rate, regular rhythm and normal heart sounds.  Pulmonary/Chest: Effort normal and breath sounds normal. No stridor. No respiratory distress.  Neurological: She is alert and oriented to person, place, and time.  Psychiatric: She has a normal mood and affect. Her behavior is normal. Judgment and thought content normal.     Urine hcg negative   Assessment and Plan Demetria was seen today for contraception.  Diagnoses and all orders for this visit:  PMS (premenstrual syndrome)  Encounter for initial prescription of transdermal patch hormonal contraceptive device -     POCT urine pregnancy -     norelgestromin-ethinyl estradiol (ORTHO EVRA) 150-35 MCG/24HR transdermal patch; Place 1 patch onto the skin once a  week.  Education given regarding options for contraception, including barrier methods, injectable contraception, IUD placement, oral contraceptives.  Pt decided to move forward with patch. Discussed MOA and common side effects. Discussed how to apply patch.     Narcissa

## 2018-06-10 ENCOUNTER — Other Ambulatory Visit: Payer: Self-pay

## 2018-06-10 ENCOUNTER — Ambulatory Visit (INDEPENDENT_AMBULATORY_CARE_PROVIDER_SITE_OTHER): Payer: BLUE CROSS/BLUE SHIELD | Admitting: Physician Assistant

## 2018-06-10 ENCOUNTER — Ambulatory Visit: Payer: Self-pay | Admitting: *Deleted

## 2018-06-10 ENCOUNTER — Encounter: Payer: Self-pay | Admitting: Physician Assistant

## 2018-06-10 VITALS — BP 106/70 | HR 67 | Temp 98.4°F | Resp 16 | Ht 64.0 in | Wt 148.6 lb

## 2018-06-10 DIAGNOSIS — R112 Nausea with vomiting, unspecified: Secondary | ICD-10-CM

## 2018-06-10 DIAGNOSIS — N912 Amenorrhea, unspecified: Secondary | ICD-10-CM | POA: Diagnosis not present

## 2018-06-10 DIAGNOSIS — F411 Generalized anxiety disorder: Secondary | ICD-10-CM | POA: Diagnosis not present

## 2018-06-10 LAB — POCT URINE PREGNANCY: PREG TEST UR: NEGATIVE

## 2018-06-10 MED ORDER — ONDANSETRON 8 MG PO TBDP: 8.0000 mg | ORAL_TABLET | Freq: Three times a day (TID) | ORAL | 0 refills | Status: DC | PRN

## 2018-06-10 MED ORDER — BUSPIRONE HCL 5 MG PO TABS: 5.0000 mg | ORAL_TABLET | Freq: Two times a day (BID) | ORAL | 0 refills | Status: DC

## 2018-06-10 NOTE — Telephone Encounter (Signed)
Pt called with complaints of stomach pains and body aches; she states that she vomited last night 3 times, and she feels dehydrated; the pt says she took a dietary supplement (protazem mood supplement) and she took 2 tablets instead of 1; the pt says she still feels nauseous and has body aches; she is most concerned about the nausea and says that she needs a MD's note for work; recommendations made per nurse triage protocol to include being seen by PCP today; pt normally sees Dr Delia Chimes but she has no availability; pt offered and accepted appointment with Tenna Delaine, [place] [address], today at 1140; she verbalizes understanding.   Reason for Disposition . Patient sounds very sick or weak to the triager  Answer Assessment - Initial Assessment Questions 1. NAUSEA SEVERITY: "How bad is the nausea?" (e.g., mild, moderate, severe; dehydration, weight loss)   - MILD: loss of appetite without change in eating habits   - MODERATE: decreased oral intake without significant weight loss, dehydration, or malnutrition   - SEVERE: inadequate caloric or fluid intake, significant weight loss, symptoms of dehydration     moderate 2. ONSET: "When did the nausea begin?"     06/09/18 3. VOMITING: "Any vomiting?" If so, ask: "How many times today?"     Yes 3 times on 06/09/18; dry heaves x 1 today 4. RECURRENT SYMPTOM: "Have you had nausea before?" If so, ask: "When was the last time?" "What happened that time?"     Yes 1 year ago; same reaction with a different medication 5. CAUSE: "What do you think is causing the nausea?"     Taking too much of a dietary supplement 6. PREGNANCY: "Is there any chance you are pregnant?" (e.g., unprotected intercourse, missed birth control pill, broken condom)     Yes; was prescribed the patch but she took it off  Protocols used: NAUSEA-A-AH

## 2018-06-10 NOTE — Patient Instructions (Addendum)
For right now, use zofran for nausea as needed and stick to a bland diet.  After you are feeling better, start buspar once daily x 4 days, if tolerating well can go to one tablet twice daily. I also recommend therapy.  For therapy -- [facility] ([address] [place]) - [phone] [facility] [place]) - [facility] - [phone] [facility] [place] - [phone] [facility]  - [phone] (do not file insurance)  Plan to return here for follow up in 4 weeks.     If you have lab work done today you will be contacted with your lab results within the next 2 weeks.  If you have not heard from [place] then please contact us. The fastest way to get your results is to register for My Chart.   IF you received an x-ray today, you will receive an invoice from [facility]. Please contact [facility] at [phone] with questions or concerns regarding your invoice.   IF you received labwork today, you will receive an invoice from [place]. Please contact [facility] at [phone] with questions or concerns regarding your invoice.   Our billing staff will not be able to assist you with questions regarding bills from these companies.  You will be contacted with the lab results as soon as they are available. The fastest way to get your results is to activate your My Chart account. Instructions are located on the last page of this paperwork. If you have not heard from [place] regarding the results in 2 weeks, please contact this office.

## 2018-06-10 NOTE — Progress Notes (Signed)
Sabrina [name]  MRN: [id number] DOB: 1976-08-19  Subjective:  Sabrina [name] is a 42 y.o. female who presents for evaluation of nausea and vomiting. Onset of symptoms was yesterday. Patient describes nausea as moderate. Vomiting has occurred 3 times over the past 24  Hours, no episodes today. Vomitus is described as normal gastric contents. Symptoms have been associated with suspicious food/drink: and increase in new herbal supplements for anxiety. Patient denies alcohol overuse, fever, hematemesis and melena. Symptoms have gradually improved. Evaluation to date has been none. Treatment to date has been none. Has been tolerating liquid and food today.  Has not taken supplement this morning.  Last normal bowel movement was 2 days ago.  Typically has bowel movement every 2 to 3 days.  LMP 05/08/2018.  She is sexually active not taking any birth control.  She does not want birth control.  Does not mind if she gets pregnant.  In terms of anxiety, this is worse when she is at school.  Typically at school for 8-hour period.  Feels stressed because it is very clicky.  Has tried Celexa in the past because of decreased libido so she discontinued it.  Has not had in 3 months.  This is when she was taken the protazen supplement.  She denies dysphoric mood, SI, HI, and hallucinations.  No past medical history of seizure disorder.  Has never tried counseling.  Review of Systems  Constitutional: Negative for chills and diaphoresis.  Gastrointestinal: Negative for abdominal distention, abdominal pain, blood in stool, constipation and diarrhea.  Psychiatric/Behavioral: Negative for agitation.    Patient Active Problem List   Diagnosis Date Noted  . Hyperthyroidism 07/10/2017  . Left anterior knee pain 08/07/2016  . Fibroids 10/13/2013    Current Outpatient Medications on File Prior to Visit  Medication Sig Dispense Refill  . citalopram (CELEXA) 10 MG tablet Take 10 mg by mouth daily.    . methimazole  (TAPAZOLE) 5 MG tablet   0  . Multiple Vitamin (MULTIVITAMIN) tablet Take 1 tablet by mouth daily.    . norelgestromin-ethinyl estradiol (ORTHO EVRA) 150-35 MCG/24HR transdermal patch Place 1 patch onto the skin once a week. (Patient not taking: Reported on 06/10/2018) 3 patch 12   No current facility-administered medications on file prior to visit.     No Known Allergies    Social History   Socioeconomic History  . Marital status: Single    Spouse name: Not on file  . Number of children: 0  . Years of education: Not on file  . Highest education level: Not on file  Occupational History  . Not on file  Social Needs  . Financial resource strain: Not on file  . Food insecurity:    Worry: Not on file    Inability: Not on file  . Transportation needs:    Medical: Not on file    Non-medical: Not on file  Tobacco Use  . Smoking status: Current Every Day Smoker    Types: Cigarettes    Last attempt to quit: 11/06/2014    Years since quitting: 3.5  . Smokeless tobacco: Never Used  Substance and Sexual Activity  . Alcohol use: No    Alcohol/week: 0.0 standard drinks  . Drug use: No  . Sexual activity: Yes  Lifestyle  . Physical activity:    Days per week: Not on file    Minutes per session: Not on file  . Stress: Not on file  Relationships  . Social connections:  Talks on phone: Not on file    Gets together: Not on file    Attends religious service: Not on file    Active member of club or organization: Not on file    Attends meetings of clubs or organizations: Not on file    Relationship status: Not on file  . Intimate partner violence:    Fear of current or ex partner: Not on file    Emotionally abused: Not on file    Physically abused: Not on file    Forced sexual activity: Not on file  Other Topics Concern  . Not on file  Social History Narrative   Massage therapist    Objective:  BP 106/70 (BP Location: Right Arm, Patient Position: Sitting, Cuff Size: Normal)    Pulse 67   Temp 98.4 F (36.9 C) (Oral)   Resp 16   Ht 5\' 4"  (1.626 m)   Wt 148 lb 9.6 oz (67.4 kg)   LMP 05/08/2018   SpO2 98%   BMI 25.51 kg/m   Physical Exam  Constitutional: She is oriented to person, place, and time. She appears well-developed and well-nourished. No distress.  HENT:  Head: Normocephalic and atraumatic.  Eyes: Conjunctivae are normal.  Neck: Normal range of motion.  Pulmonary/Chest: Effort normal.  Abdominal: Soft. Normal appearance and bowel sounds are normal. She exhibits no distension and no fluid wave. There is no hepatosplenomegaly. There is no tenderness. There is no rigidity, no rebound, no guarding, no tenderness at McBurney's point and negative [name]'s sign.  Neurological: She is alert and oriented to person, place, and time.  Skin: Skin is warm and dry.  Psychiatric: She has a normal mood and affect.  Vitals reviewed.    Results for orders placed or performed in visit on 06/10/18 (from the past 24 hour(s))  POCT urine pregnancy     Status: None   Collection Time: 06/10/18 12:01 PM  Result Value Ref Range   Preg Test, Ur Negative Negative   GAD 7 : Generalized Anxiety Score 06/10/2018 07/03/2017  Nervous, Anxious, on Edge 1 2  Control/stop worrying 1 0  Worry too much - different things 1 0  Trouble relaxing 1 1  Restless 1 1  Easily annoyed or irritable 1 1  Afraid - awful might happen 0 0  Total GAD 7 Score 6 5  Anxiety Difficulty Somewhat difficult Not difficult at all   Depression screen [facility] 2/9 06/10/2018 06/10/2018 04/25/2018 04/25/2018 01/19/2018  Decreased Interest 1 1 0 0 0  Down, Depressed, Hopeless 1 1 0 0 0  PHQ - 2 Score 2 2 0 0 0  Altered sleeping 1 1 - - -  Tired, decreased energy 1 1 - - -  Change in appetite 1 1 - - -  Feeling bad or failure about yourself  0 1 - - -  Trouble concentrating 1 1 - - -  Moving slowly or fidgety/restless 1 1 - - -  Suicidal thoughts 0 0 - - -  PHQ-9 Score 7 8 - - -  Difficult doing  work/chores Somewhat difficult Somewhat difficult - - -     Assessment and Plan :  1. Non-intractable vomiting with nausea, unspecified vomiting type Likely to doubling dose of recently added on herbal supplement.  Reassuring that she is feeling better today.  No vomiting today.  Patient is overall well-appearing, no acute distress.  Vital stable.  No acute findings noted on physical exam.  Point-of-care pregnancy test negative.  Encouraged  to discontinue supplement.  Increase hydration.  Bland diet.  May use Zofran as needed.  Given strict ED precautions. - ondansetron (ZOFRAN-ODT) 8 MG disintegrating tablet; Take 1 tablet (8 mg total) by mouth every 8 (eight) hours as needed for nausea.  Dispense: 20 tablet; Refill: 0  2. Amenorrhea Pregnancy test negative.  Patient declines birth control. - POCT urine pregnancy  3. Anxiety state Will attempt trial of BuSpar as patient's anxiety is most notable while at school and she had decreased libido with Celexa.  Denies SI or HI.  Advised to follow-up in 4 weeks for reevaluation.  Also recommended seeking out counseling.  Given contact information for local therapist. - busPIRone (BUSPAR) 5 MG tablet; Take 1 tablet (5 mg total) by mouth 2 (two) times daily.  Dispense: 60 tablet; Refill: 0  Side effects, risks, benefits, and alternatives of the medications and treatment plan prescribed today were discussed, and patient expressed understanding of the instructions given. No barriers to understanding were identified. Red flags discussed in detail. Pt expressed understanding regarding what to do in case of emergency/urgent symptoms.    Tenna Delaine PA-C  [facility] 06/10/2018 12:49 PM

## 2018-07-03 ENCOUNTER — Other Ambulatory Visit: Payer: Self-pay | Admitting: Physician Assistant

## 2018-07-03 DIAGNOSIS — F411 Generalized anxiety disorder: Secondary | ICD-10-CM

## 2018-07-04 ENCOUNTER — Other Ambulatory Visit: Payer: Self-pay | Admitting: Physician Assistant

## 2018-07-04 DIAGNOSIS — F411 Generalized anxiety disorder: Secondary | ICD-10-CM

## 2018-07-04 NOTE — Telephone Encounter (Signed)
Patient called, left VM to return call to the office to schedule a follow up with Dr. Nolon Rod or Tenna Delaine, PA-C. Noted last OV with [place] to f/u in 4 weeks for anxiety.  Buspar refill Last refill:06/10/18 #60/0 refill Last OV:06/10/18 with [place] [id number] Pharmacy: [id number] [address], [place] [phone] (Phone) [phone] (Fax)

## 2018-07-04 NOTE — Telephone Encounter (Signed)
Attempted to call patient regarding a follow up appointment regarding her buspirone. No answer, left message to call back and schedule an appointment with her pcp.

## 2018-07-19 ENCOUNTER — Other Ambulatory Visit: Payer: Self-pay | Admitting: Physician Assistant

## 2018-07-19 DIAGNOSIS — F411 Generalized anxiety disorder: Secondary | ICD-10-CM

## 2018-07-20 NOTE — Telephone Encounter (Signed)
Requested medication (s) are due for refill today: Yes  Requested medication (s) are on the active medication list: Yes  Last refill:  07/05/18  Future visit scheduled: No  Notes to clinic:  Asking for 90 day supply    Requested Prescriptions  Pending Prescriptions Disp Refills   busPIRone (BUSPAR) 5 MG tablet [Pharmacy Med Name: BUSPIRONE HCL 5 MG TABLET] 180 tablet 1    Sig: TAKE 1 TABLET BY MOUTH TWICE A DAY     Not Delegated - Psychiatry:  Anxiolytics/Hypnotics Failed - 07/19/2018  8:37 PM      Failed - This refill cannot be delegated      Failed - Urine Drug Screen completed in last 360 days.      Passed - Valid encounter within last 6 months    Recent Outpatient Visits          1 month ago Non-intractable vomiting with nausea, unspecified vomiting type   [facility], [place] D, PA-C   2 months ago PMS (premenstrual syndrome)   [facility] at [place], MD   3 months ago Foreign body in vagina, initial encounter   [facility] at [place], PA-C   6 months ago Post viral syndrome   [facility] at [place], PA-C   6 months ago Influenza A   [facility] at [place], [place], [place]           Yes

## 2018-07-26 ENCOUNTER — Emergency Department (HOSPITAL_COMMUNITY): Payer: BLUE CROSS/BLUE SHIELD

## 2018-07-26 ENCOUNTER — Emergency Department (HOSPITAL_COMMUNITY)
Admission: EM | Admit: 2018-07-26 | Discharge: 2018-07-26 | Disposition: A | Discharge status: Home / Self Care | Payer: BLUE CROSS/BLUE SHIELD | Attending: Emergency Medicine | Admitting: Emergency Medicine

## 2018-07-26 ENCOUNTER — Encounter (HOSPITAL_COMMUNITY): Payer: Self-pay | Admitting: Emergency Medicine

## 2018-07-26 DIAGNOSIS — Z3A01 Less than 8 weeks gestation of pregnancy: Secondary | ICD-10-CM | POA: Insufficient documentation

## 2018-07-26 DIAGNOSIS — O2341 Unspecified infection of urinary tract in pregnancy, first trimester: Secondary | ICD-10-CM | POA: Insufficient documentation

## 2018-07-26 DIAGNOSIS — N390 Urinary tract infection, site not specified: Secondary | ICD-10-CM

## 2018-07-26 DIAGNOSIS — N939 Abnormal uterine and vaginal bleeding, unspecified: Secondary | ICD-10-CM

## 2018-07-26 DIAGNOSIS — R319 Hematuria, unspecified: Secondary | ICD-10-CM

## 2018-07-26 DIAGNOSIS — O9989 Other specified diseases and conditions complicating pregnancy, childbirth and the puerperium: Secondary | ICD-10-CM | POA: Diagnosis present

## 2018-07-26 LAB — URINALYSIS, ROUTINE W REFLEX MICROSCOPIC
Bilirubin Urine: NEGATIVE
Glucose, UA: NEGATIVE mg/dL
Ketones, ur: NEGATIVE mg/dL
Nitrite: NEGATIVE
PROTEIN: NEGATIVE mg/dL
SPECIFIC GRAVITY, URINE: 1.019 (ref 1.005–1.030)
pH: 6 (ref 5.0–8.0)

## 2018-07-26 LAB — CBC WITH DIFFERENTIAL/PLATELET
Abs Immature Granulocytes: 0.05 10*3/uL (ref 0.00–0.07)
Basophils Absolute: 0 10*3/uL (ref 0.0–0.1)
Basophils Relative: 0 %
EOS PCT: 1 %
Eosinophils Absolute: 0.1 10*3/uL (ref 0.0–0.5)
HCT: 43.3 % (ref 36.0–46.0)
HEMOGLOBIN: 14 g/dL (ref 12.0–15.0)
IMMATURE GRANULOCYTES: 0 %
LYMPHS PCT: 24 %
Lymphs Abs: 3.1 10*3/uL (ref 0.7–4.0)
MCH: 30.6 pg (ref 26.0–34.0)
MCHC: 32.3 g/dL (ref 30.0–36.0)
MCV: 94.7 fL (ref 80.0–100.0)
Monocytes Absolute: 0.9 10*3/uL (ref 0.1–1.0)
Monocytes Relative: 7 %
NEUTROS ABS: 8.7 10*3/uL — AB (ref 1.7–7.7)
NEUTROS PCT: 68 %
NRBC: 0 % (ref 0.0–0.2)
Platelets: 300 10*3/uL (ref 150–400)
RBC: 4.57 MIL/uL (ref 3.87–5.11)
RDW: 13 % (ref 11.5–15.5)
WBC: 12.9 10*3/uL — AB (ref 4.0–10.5)

## 2018-07-26 LAB — COMPREHENSIVE METABOLIC PANEL
ALBUMIN: 4.1 g/dL (ref 3.5–5.0)
ALK PHOS: 58 U/L (ref 38–126)
ALT: 28 U/L (ref 0–44)
ANION GAP: 8 (ref 5–15)
AST: 33 U/L (ref 15–41)
BUN: 8 mg/dL (ref 6–20)
CO2: 25 mmol/L (ref 22–32)
Calcium: 9.5 mg/dL (ref 8.9–10.3)
Chloride: 108 mmol/L (ref 98–111)
Creatinine, Ser: 0.61 mg/dL (ref 0.44–1.00)
GFR calc Af Amer: >60 mL/min (ref >60)
GFR calc non Af Amer: >60 mL/min (ref >60)
GLUCOSE: 84 mg/dL (ref 70–99)
POTASSIUM: 3.9 mmol/L (ref 3.5–5.1)
Sodium: 141 mmol/L (ref 135–145)
Total Bilirubin: 0.4 mg/dL (ref 0.3–1.2)
Total Protein: 8.1 g/dL (ref 6.5–8.1)

## 2018-07-26 LAB — HCG, QUANTITATIVE, PREGNANCY: HCG, BETA CHAIN, QUANT, S: 48344 m[IU]/mL — AB (ref <5)

## 2018-07-26 LAB — WET PREP, GENITAL
CLUE CELLS WET PREP: NONE SEEN
Sperm: NONE SEEN
Trich, Wet Prep: NONE SEEN
WBC WET PREP: NONE SEEN
Yeast Wet Prep HPF POC: NONE SEEN

## 2018-07-26 MED ORDER — CEPHALEXIN 500 MG PO CAPS: 500.0000 mg | ORAL_CAPSULE | Freq: Two times a day (BID) | ORAL | 0 refills | Status: AC

## 2018-07-26 NOTE — Discharge Instructions (Addendum)
Please make sure you attend your appointment next week with your OBGYN. I have prescribed antibiotic to treat your infection, please take as directed. If you experience any fever, worsening vaginal bleeding or abdominal pain return to the ED for reevaluation.

## 2018-07-26 NOTE — ED Provider Notes (Signed)
[facility] DEPT Provider Note   CSN: [id number] Arrival date & time: 07/26/18  1055     History   Chief Complaint Chief Complaint  Patient presents with  . Hematuria  . Constipation    HPI Sabrina [name] is a 42 y.o. female.  42 y.o female G1P0 currently [redacted] weeks pregnant with a PMH of Anemia, Anxiety, Depression presents to the ED with a chief complaint of hematuria since last night. Patient reports she used the bathroom yesterday and noted blood in the tissue along with small clots. She reports she had a positive pregnancy test at home recently but has not had any OB care for her pregnancy. She reports [facility] told her she was about [redacted] weeks pregnant based on her LMP. 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 She also reports some constipation but had a bowel movement this morning. She does report having sex the night before but no bleeding at the time. She denies any chest pain, headache, or leg swelling. Patient reports she has an appoitnment with her OBGYN to establish care on 07/28/2018.     Past Medical History:  Diagnosis Date  . Anemia   . Anxiety   . Depression   . Fibroids     Patient Active Problem List   Diagnosis Date Noted  . Hyperthyroidism 07/10/2017  . Left anterior knee pain 08/07/2016  . Fibroids 10/13/2013    Past Surgical History:  Procedure Laterality Date  . MYOMECTOMY    . RECTAL EXAMINATION UNDER ANESTHESIA W/ CYSTOSCOPY       OB History    Gravida  1   Para      Term      Preterm      AB      Living        SAB      TAB      Ectopic      Multiple      Live Births               Home Medications    Prior to Admission medications   Medication Sig Start Date End Date Taking? Authorizing Provider  citalopram (CELEXA) 10 MG tablet Take 10 mg by mouth daily.   Yes [provider]  Multiple Vitamin  (MULTIVITAMIN) tablet Take 1 tablet by mouth daily.   Yes [provider]  ondansetron (ZOFRAN-ODT) 8 MG disintegrating tablet Take 1 tablet (8 mg total) by mouth every 8 (eight) hours as needed for nausea. 06/10/18  Yes Timmothy Euler, [place] D, PA-C  busPIRone (BUSPAR) 5 MG tablet TAKE 1 TABLET BY MOUTH TWICE A DAY Patient not taking: Reported on 07/26/2018 07/05/18   McVey, Gelene Mink, PA-C  cephALEXin (KEFLEX) 500 MG capsule Take 1 capsule (500 mg total) by mouth 2 (two) times daily for 7 days. 07/26/18 08/02/18  Janeece Fitting, PA-C  norelgestromin-ethinyl estradiol (ORTHO EVRA) 150-35 MCG/24HR transdermal patch Place 1 patch onto the skin once a week. Patient not taking: Reported on 06/10/2018 04/25/18   Forrest Moron, MD    Family History Family History  Problem Relation Age of Onset  . Mental illness Father   . Cancer Father   . Hypertension Mother   . Thyroid disease Neg Hx     Social History Social History   Tobacco Use  . Smoking status: Current Every Day Smoker    Types: Cigarettes    Last attempt to  quit: 11/06/2014    Years since quitting: 3.7  . Smokeless tobacco: Never Used  Substance Use Topics  . Alcohol use: No    Alcohol/week: 0.0 standard drinks  . Drug use: No     Allergies   Patient has no known allergies.   Review of Systems Review of Systems  Constitutional: Negative for chills and fever.  HENT: Negative for sore throat.   Respiratory: Positive for shortness of breath.   Cardiovascular: Negative for chest pain.  Gastrointestinal: Negative for abdominal pain, diarrhea and vomiting.  Genitourinary: Positive for hematuria. Negative for dysuria and flank pain.  Musculoskeletal: Negative for back pain.  Skin: Negative for pallor and wound.  Neurological: Negative for light-headedness and headaches.  All other systems reviewed and are negative.    Physical Exam Updated Vital Signs BP 107/61   Pulse 65   Temp 98.3 F (36.8 C) (Oral)    Resp 16   Ht 5\' 5"  (1.651 m)   Wt 68 kg   LMP 06/05/2018   SpO2 100%   BMI 24.96 kg/m   Physical Exam  Constitutional: She is oriented to person, place, and time. She appears well-developed and well-nourished.  HENT:  Head: Normocephalic and atraumatic.  Neck: Normal range of motion. Neck supple.  Cardiovascular: Normal heart sounds.  Pulmonary/Chest: Breath sounds normal. She has no wheezes.  Abdominal: Soft. Bowel sounds are normal. There is no tenderness.  Genitourinary: Pelvic exam was performed with patient supine. There is bleeding in the vagina. No vaginal discharge found.  Genitourinary Comments: Small amount of blood noted in vaginal vault, no clots noted Chaperoned by Deandra NT  Neurological: She is alert and oriented to person, place, and time.  Skin: Skin is warm and dry.  Nursing note and vitals reviewed.    ED Treatments / Results  Labs (all labs ordered are listed, but only abnormal results are displayed) Labs Reviewed  URINALYSIS, ROUTINE W REFLEX MICROSCOPIC - Abnormal; Notable for the following components:      Result Value   Color, Urine AMBER (*)    APPearance CLOUDY (*)    Hgb urine dipstick SMALL (*)    Leukocytes, UA TRACE (*)    Bacteria, UA FEW (*)    All other components within normal limits  HCG, QUANTITATIVE, PREGNANCY - Abnormal; Notable for the following components:   hCG, Beta Chain, Quant, S 48,344 (*)    All other components within normal limits  CBC WITH DIFFERENTIAL/PLATELET - Abnormal; Notable for the following components:   WBC 12.9 (*)    Neutro Abs 8.7 (*)    All other components within normal limits  WET PREP, GENITAL  COMPREHENSIVE METABOLIC PANEL  GC/CHLAMYDIA PROBE AMP ([facility]) NOT AT [facility]    EKG None  Radiology US Ob Comp < 14 Wks  Result Date: 07/26/2018 CLINICAL DATA:  Vaginal bleeding. EXAM: OBSTETRIC <14 WK [place] AND TRANSVAGINAL OB US TECHNIQUE: Both transabdominal and transvaginal ultrasound examinations  were performed for complete evaluation of the gestation as well as the maternal uterus, adnexal regions, and pelvic cul-de-sac. Transvaginal technique was performed to assess early pregnancy. COMPARISON:  11/25/2012. FINDINGS: Intrauterine gestational sac: Single Yolk sac:  9 Embryo:  Visualized Cardiac Activity: Visualized Heart Rate: 113 bpm MSD: 16 mm   6 w   3 d Subchorionic hemorrhage: A small subchorionic hemorrhage cannot be completely excluded. Evaluation is difficult due to multiple large fibroids. No large subchorionic hemorrhage noted. Maternal uterus/adnexae: Multiple large fibroids are noted. Fibroids measures up  to 4 cm. No free fluid. IMPRESSION: 1. Single viable intrauterine pregnancy at 6 weeks 3 days with a fetal heart rate 113 beats per minute. 2. Multiple large uterine fibroids. This makes evaluation for small sub chorionic hemorrhage difficult. Follow-up exam can be obtained as needed. No large subchorionic hemorrhage demonstrated. Electronically Signed   By: Marcello Moores  Register   On: 07/26/2018 15:06   US Ob Transvaginal  Result Date: 07/26/2018 CLINICAL DATA:  Vaginal bleeding. EXAM: OBSTETRIC <14 WK [place] AND TRANSVAGINAL OB US TECHNIQUE: Both transabdominal and transvaginal ultrasound examinations were performed for complete evaluation of the gestation as well as the maternal uterus, adnexal regions, and pelvic cul-de-sac. Transvaginal technique was performed to assess early pregnancy. COMPARISON:  11/25/2012. FINDINGS: Intrauterine gestational sac: Single Yolk sac:  9 Embryo:  Visualized Cardiac Activity: Visualized Heart Rate: 113 bpm MSD: 16 mm   6 w   3 d Subchorionic hemorrhage: A small subchorionic hemorrhage cannot be completely excluded. Evaluation is difficult due to multiple large fibroids. No large subchorionic hemorrhage noted. Maternal uterus/adnexae: Multiple large fibroids are noted. Fibroids measures up to 4 cm. No free fluid. IMPRESSION: 1. Single viable intrauterine  pregnancy at 6 weeks 3 days with a fetal heart rate 113 beats per minute. 2. Multiple large uterine fibroids. This makes evaluation for small sub chorionic hemorrhage difficult. Follow-up exam can be obtained as needed. No large subchorionic hemorrhage demonstrated. Electronically Signed   By: Marcello Moores  Register   On: 07/26/2018 15:06    Procedures Procedures (including critical care time)  Medications Ordered in ED Medications - No data to display   Initial Impression / Assessment and Plan / ED Course  I have reviewed the triage vital signs and the nursing notes.  Pertinent labs & imaging results that were available during my care of the patient were reviewed by me and considered in my medical decision making (see chart for details).    Patient presents with hematuria which began last night. She is currently approximately [redacted] weeks pregnant but has not had any prenatal care or follow up appointment after her home pregnancy test.  CBC showed slight leukocytosis, hemoglobin is stable. CMP showed no abnormalities, electrolytes are within normal limits.  UA showed small heme globin, leukocytes trace, few bacteria. Obtain an ultrasound OB in order to rule out any ectopic, miscarriage, as patient has not had any ultrasound done to prove that this is in fact a intrauterine pregnancy.US OB transvaginal showed single viable intrauterine pregnancy at 6 weeks and 3 days with a fetal heart rate of 113 bpm.  Multiple large uterine fibroids makes evaluation for small subchorionic hemorrhage difficult.  I have advised patient that she needs to follow-up with her OB/GYN in order to establish care and have her repeat her quant.  Patient is to return to the ED if she experiences any fever, worsening bleeding, chills.  Patient understands and agrees, husband at the bedside.    Final Clinical Impressions(s) / ED Diagnoses   Final diagnoses:  Lower urinary tract infectious disease  Hematuria, unspecified type    Vaginal bleeding    ED Discharge Orders         Ordered    cephALEXin (KEFLEX) 500 MG capsule  2 times daily     07/26/18 1545           Janeece Fitting, PA-C 07/26/18 1552    Julianne Rice, MD 07/27/18 [phone]

## 2018-07-26 NOTE — ED Triage Notes (Signed)
Pt c/o blood in urine since last night. Reports that she is in her first trimester of pregnancy.

## 2018-07-26 NOTE — ED Triage Notes (Signed)
Pt adds that she having constipation for over past week. Had to strain to have BM today.

## 2018-07-27 LAB — GC/CHLAMYDIA PROBE AMP (~~LOC~~) NOT AT ARMC
CHLAMYDIA, DNA PROBE: NEGATIVE
NEISSERIA GONORRHEA: NEGATIVE

## 2018-08-02 ENCOUNTER — Encounter (HOSPITAL_COMMUNITY): Payer: Self-pay | Admitting: Emergency Medicine

## 2018-08-02 ENCOUNTER — Emergency Department (HOSPITAL_COMMUNITY)
Admission: EM | Admit: 2018-08-02 | Discharge: 2018-08-02 | Disposition: A | Discharge status: Home / Self Care | Payer: BLUE CROSS/BLUE SHIELD | Attending: Emergency Medicine | Admitting: Emergency Medicine

## 2018-08-02 DIAGNOSIS — O26891 Other specified pregnancy related conditions, first trimester: Secondary | ICD-10-CM | POA: Diagnosis not present

## 2018-08-02 DIAGNOSIS — Z3490 Encounter for supervision of normal pregnancy, unspecified, unspecified trimester: Secondary | ICD-10-CM

## 2018-08-02 DIAGNOSIS — Z3A00 Weeks of gestation of pregnancy not specified: Secondary | ICD-10-CM | POA: Insufficient documentation

## 2018-08-02 DIAGNOSIS — R110 Nausea: Secondary | ICD-10-CM | POA: Diagnosis not present

## 2018-08-02 NOTE — ED Provider Notes (Signed)
[facility] DEPT Provider Note   CSN: [id number] Arrival date & time: 08/02/18  3818     History   Chief Complaint Chief Complaint  Patient presents with  . Follow-up    HPI Sabrina [name] is a 42 y.o. female.  42 year old female presents here with ongoing nausea related to her first trimester pregnancy.  Had emesis x1 today but denies any weakness.  No change to her urine.  No abdominal discomfort.  No vaginal bleeding or discharge.  Has a secondary complaint of needing a work note from her prior visit where she was diagnosed with UTI.  States that she has been compliant with her medications.  Has had no back pain.     Past Medical History:  Diagnosis Date  . Anemia   . Anxiety   . Depression   . Fibroids     Patient Active Problem List   Diagnosis Date Noted  . Hyperthyroidism 07/10/2017  . Left anterior knee pain 08/07/2016  . Fibroids 10/13/2013    Past Surgical History:  Procedure Laterality Date  . MYOMECTOMY    . RECTAL EXAMINATION UNDER ANESTHESIA W/ CYSTOSCOPY       OB History    Gravida  1   Para      Term      Preterm      AB      Living        SAB      TAB      Ectopic      Multiple      Live Births               Home Medications    Prior to Admission medications   Medication Sig Start Date End Date Taking? Authorizing Provider  citalopram (CELEXA) 10 MG tablet Take 10 mg by mouth daily.   Yes [provider]  Multiple Vitamin (MULTIVITAMIN) tablet Take 1 tablet by mouth daily.   Yes [provider]  busPIRone (BUSPAR) 5 MG tablet TAKE 1 TABLET BY MOUTH TWICE A DAY Patient not taking: Reported on 07/26/2018 07/05/18   McVey, Gelene Mink, PA-C  cephALEXin (KEFLEX) 500 MG capsule Take 1 capsule (500 mg total) by mouth 2 (two) times daily for 7 days. Patient not taking: Reported on 08/02/2018 07/26/18 08/02/18  Janeece Fitting, PA-C  norelgestromin-ethinyl estradiol  (ORTHO EVRA) 150-35 MCG/24HR transdermal patch Place 1 patch onto the skin once a week. Patient not taking: Reported on 06/10/2018 04/25/18   Forrest Moron, MD  ondansetron (ZOFRAN-ODT) 8 MG disintegrating tablet Take 1 tablet (8 mg total) by mouth every 8 (eight) hours as needed for nausea. Patient not taking: Reported on 08/02/2018 06/10/18   Leonie Douglas, PA-C    Family History Family History  Problem Relation Age of Onset  . Mental illness Father   . Cancer Father   . Hypertension Mother   . Thyroid disease Neg Hx     Social History Social History   Tobacco Use  . Smoking status: Current Every Day Smoker    Types: Cigarettes    Last attempt to quit: 11/06/2014    Years since quitting: 3.7  . Smokeless tobacco: Never Used  Substance Use Topics  . Alcohol use: No    Alcohol/week: 0.0 standard drinks  . Drug use: No     Allergies   Patient has no known allergies.   Review of Systems Review of Systems  All other systems reviewed and are negative.  Physical Exam Updated Vital Signs BP 129/81 (BP Location: Left Arm)   Pulse 78   Temp 98 F (36.7 C) (Oral)   Resp 16   LMP 06/05/2018   SpO2 100%   Physical Exam  Constitutional: She is oriented to person, place, and time. She appears well-developed and well-nourished.  Non-toxic appearance. No distress.  HENT:  Head: Normocephalic and atraumatic.  Eyes: Pupils are equal, round, and reactive to light. Conjunctivae, EOM and lids are normal.  Neck: Normal range of motion. Neck supple. No tracheal deviation present. No thyroid mass present.  Cardiovascular: Normal rate, regular rhythm and normal heart sounds. Exam reveals no gallop.  No murmur heard. Pulmonary/Chest: Effort normal and breath sounds normal. No stridor. No respiratory distress. She has no decreased breath sounds. She has no wheezes. She has no rhonchi. She has no rales.  Abdominal: Soft. Normal appearance and bowel sounds are normal. She  exhibits no distension. There is no tenderness. There is no rebound and no CVA tenderness.  Musculoskeletal: Normal range of motion. She exhibits no edema or tenderness.  Neurological: She is alert and oriented to person, place, and time. She has normal strength. No cranial nerve deficit or sensory deficit. GCS eye subscore is 4. GCS verbal subscore is 5. GCS motor subscore is 6.  Skin: Skin is warm and dry. No abrasion and no rash noted.  Psychiatric: She has a normal mood and affect. Her speech is normal and behavior is normal.  Nursing note and vitals reviewed.    ED Treatments / Results  Labs (all labs ordered are listed, but only abnormal results are displayed) Labs Reviewed - No data to display  EKG None  Radiology No results found.  Procedures Procedures (including critical care time)  Medications Ordered in ED Medications - No data to display   Initial Impression / Assessment and Plan / ED Course  I have reviewed the triage vital signs and the nursing notes.  Pertinent labs & imaging results that were available during my care of the patient were reviewed by me and considered in my medical decision making (see chart for details).     Instructed to follow-up with her OB doctor.  Given information about nonprescription treatment for nausea associate with first trimester pregnancy.  Will give work excuse for 1 week  Final Clinical Impressions(s) / ED Diagnoses   Final diagnoses:  None    ED Discharge Orders    None       Lacretia Leigh, MD 08/02/18 1136

## 2018-08-02 NOTE — ED Triage Notes (Signed)
Per pt, states she was here on 10/15 for hematuria-states she is here for a follow up with PA Soto-states she is pregnant and has an appointment with a OBGYN MD today-states she is here for a work note-she said PA was to write her out of work for a week but was not given note-states she is nauseous as well

## 2018-09-01 ENCOUNTER — Other Ambulatory Visit: Payer: Self-pay | Admitting: Obstetrics and Gynecology

## 2018-09-01 ENCOUNTER — Other Ambulatory Visit (HOSPITAL_COMMUNITY)
Admission: RE | Admit: 2018-09-01 | Discharge: 2018-09-01 | Disposition: A | Discharge status: Home / Self Care | Payer: BLUE CROSS/BLUE SHIELD | Source: Ambulatory Visit | Attending: Obstetrics and Gynecology | Admitting: Obstetrics and Gynecology

## 2018-09-01 DIAGNOSIS — Z3400 Encounter for supervision of normal first pregnancy, unspecified trimester: Secondary | ICD-10-CM | POA: Insufficient documentation

## 2018-09-06 LAB — CYTOLOGY - PAP
CHLAMYDIA, DNA PROBE: NEGATIVE
DIAGNOSIS: NEGATIVE
HPV: NOT DETECTED
NEISSERIA GONORRHEA: NEGATIVE

## 2019-06-12 ENCOUNTER — Ambulatory Visit (INDEPENDENT_AMBULATORY_CARE_PROVIDER_SITE_OTHER): Payer: BC Managed Care – PPO | Admitting: Family Medicine

## 2019-06-12 ENCOUNTER — Other Ambulatory Visit: Payer: Self-pay

## 2019-06-12 ENCOUNTER — Encounter: Payer: Self-pay | Admitting: Family Medicine

## 2019-06-12 ENCOUNTER — Telehealth: Payer: Self-pay

## 2019-06-12 VITALS — BP 114/79 | HR 72 | Temp 97.5°F | Ht 65.0 in | Wt 156.0 lb

## 2019-06-12 DIAGNOSIS — Z30013 Encounter for initial prescription of injectable contraceptive: Secondary | ICD-10-CM | POA: Diagnosis not present

## 2019-06-12 LAB — POCT URINE PREGNANCY: Preg Test, Ur: NEGATIVE

## 2019-06-12 MED ORDER — MEDROXYPROGESTERONE ACETATE 150 MG/ML IM SUSP
150.0000 mg | Freq: Once | INTRAMUSCULAR | Status: AC
  Administered 2019-06-12: 15:00:00 150 mg via INTRAMUSCULAR

## 2019-06-12 NOTE — Progress Notes (Signed)
8/31/20202:52 PM  Sabrina [name] 1976/04/21, 43 y.o., female [id number]  Chief Complaint  Patient presents with  . Contraception    HPI:   Patient is a 43 y.o. female  who presents today for contraception  She is interested in trying depo for contraception She has tried the patch, pill - she did not like either Normal menses, not heavy LMP about 2.5 weeks ago No fhx breast or ovarian cancer, VTE No h/o migraines She does smoke  Depression screen [facility] 2/9 06/12/2019 06/10/2018 06/10/2018  Decreased Interest 0 1 1  Down, Depressed, Hopeless 0 1 1  PHQ - 2 Score 0 2 2  Altered sleeping - 1 1  Tired, decreased energy - 1 1  Change in appetite - 1 1  Feeling bad or failure about yourself  - 0 1  Trouble concentrating - 1 1  Moving slowly or fidgety/restless - 1 1  Suicidal thoughts - 0 0  PHQ-9 Score - 7 8  Difficult doing work/chores - Somewhat difficult Somewhat difficult    Fall Risk  06/12/2019 06/10/2018 04/25/2018 04/25/2018 01/19/2018  Falls in the past year? 0 No No No No  Number falls in past yr: 0 - - - -  Injury with Fall? 0 - - - -     Not on File  Prior to Admission medications   Not on File    Past Medical History:  Diagnosis Date  . Anemia   . Anxiety   . Depression   . Fibroids     Past Surgical History:  Procedure Laterality Date  . MYOMECTOMY    . RECTAL EXAMINATION UNDER ANESTHESIA W/ CYSTOSCOPY      Social History   Tobacco Use  . Smoking status: Current Every Day Smoker    Types: Cigarettes    Last attempt to quit: 11/06/2014    Years since quitting: 4.6  . Smokeless tobacco: Never Used  Substance Use Topics  . Alcohol use: No    Alcohol/week: 0.0 standard drinks    Family History  Problem Relation Age of Onset  . Mental illness Father   . Cancer Father   . Hypertension Mother   . Thyroid disease Neg Hx     ROS Per hpi  OBJECTIVE:  Today's Vitals   06/12/19 1447  BP: 114/79  Pulse: 72  Temp: (!) 97.5 F (36.4  C)  TempSrc: Oral  SpO2: 100%  Weight: 156 lb (70.8 kg)  Height: 5\' 5"  (1.651 m)   Body mass index is 25.96 kg/m.   Physical Exam Vitals signs and nursing note reviewed.  Constitutional:      Appearance: She is well-developed.  HENT:     Head: Normocephalic and atraumatic.     Mouth/Throat:     Pharynx: No oropharyngeal exudate.  Eyes:     General: No scleral icterus.    Conjunctiva/sclera: Conjunctivae normal.     Pupils: Pupils are equal, round, and reactive to light.  Neck:     Musculoskeletal: Neck supple.  Cardiovascular:     Rate and Rhythm: Normal rate and regular rhythm.     Heart sounds: Normal heart sounds. No murmur. No friction rub. No gallop.   Pulmonary:     Effort: Pulmonary effort is normal.     Breath sounds: Normal breath sounds. No wheezing or rales.  Skin:    General: Skin is warm and dry.  Neurological:     Mental Status: She is alert and oriented to person, place, and  time.       Results for orders placed or performed in visit on 06/12/19 (from the past 24 hour(s))  POCT urine pregnancy     Status: Normal   Collection Time: 06/12/19  2:58 PM  Result Value Ref Range   Preg Test, Ur Negative Negative    No results found.   ASSESSMENT and PLAN  1. Encounter for initial prescription of injectable contraceptive Discussed BC options, reviewed r/se/b. Patient would like to proceed with depo. - POCT urine pregnancy - medroxyPROGESTERone (DEPO-PROVERA) injection 150 mg  Return for nurse visit depo: Nov 16 - Dec 14.    Rutherford Guys, MD [facility] [place] [place], [place] [address] Ph.  [phone] Fax [phone]

## 2019-06-12 NOTE — Patient Instructions (Addendum)
If you have lab work done today you will be contacted with your lab results within the next 2 weeks.  If you have not heard from [place] then please contact us. The fastest way to get your results is to register for My Chart.   IF you received an x-ray today, you will receive an invoice from [facility]. Please contact [facility] at [phone] with questions or concerns regarding your invoice.   IF you received labwork today, you will receive an invoice from [place]. Please contact [facility] at [phone] with questions or concerns regarding your invoice.   Our billing staff will not be able to assist you with questions regarding bills from these companies.  You will be contacted with the lab results as soon as they are available. The fastest way to get your results is to activate your My Chart account. Instructions are located on the last page of this paperwork. If you have not heard from [place] regarding the results in 2 weeks, please contact this office.     Contraceptive Injection A contraceptive injection is a shot that prevents pregnancy. It is also called the birth control shot. The shot contains the hormone progestin, which prevents pregnancy by:  Stopping the ovaries from releasing eggs.  Thickening cervical mucus to prevent sperm from entering the cervix.  Thinning the lining of the uterus to prevent a fertilized egg from attaching to the uterus. Contraceptive injections are given under the skin (subcutaneous) or into a muscle (intramuscular). For these shots to work, you must get one of them every 3 months (12 weeks) from a health care provider. Tell a health care provider about:  Any allergies you have.  All medicines you are taking, including vitamins, herbs, eye drops, creams, and over-the-counter medicines.  Any blood disorders you have.  Any medical conditions you have.  Whether you are pregnant or may be pregnant. What are the risks? Generally,  this is a safe procedure. However, problems may occur, including:  Mood changes or depression.  Loss of bone density (osteoporosis) after long-term use.  Blood clots.  Higher risk of an egg being fertilized outside your uterus (ectopic pregnancy).This is rare. What happens before the procedure?  Your health care provider may do a routine physical exam.  You may have a test to make sure you are not pregnant. What happens during the procedure?  The area where the shot will be given will be cleaned and sanitized with alcohol.  A needle will be inserted into a muscle in your upper arm or buttock, or into the skin of your thigh or abdomen. The needle will be attached to a syringe with the medicine inside of it.  The medicine will be pushed through the syringe and injected into your body.  A small bandage (dressing) may be placed over the injection site. What can I expect after the procedure?  After the procedure, it is common to have: ? Soreness around the injection site for a couple of days. ? Irregular menstrual bleeding. ? Weight gain. ? Breast tenderness. ? Headaches. ? Discomfort in your abdomen.  Ask your health care provider whether you need to use an added method of birth control (backup contraception), such as a condom, sponge, or spermicide. ? If the first shot is given 1-7 days after the start of your last period, you will not need backup contraception. ? If the first shot is given at any other time during your menstrual cycle, you should avoid having  sex or you will need backup contraception for 7 days after you receive the shot. Follow these instructions at home: General instructions   Take over-the-counter and prescription medicines only as told by your health care provider.  Do not massage the injection site.  Track your menstrual periods so you will know if they become irregular.  Always use a condom to protect against STIs (sexually transmitted  infections).  Make sure you schedule an appointment in time for your next shot, and mark it on your calendar. For the birth control to prevent pregnancy, you must get the injections every 3 months (12 weeks). Lifestyle  Do not use any products that contain nicotine or tobacco, such as cigarettes and e-cigarettes. If you need help quitting, ask your health care provider.  Eat foods that are high in calcium and vitamin D, such as milk, cheese, and salmon. Doing this may help with any loss in bone density that is caused by the contraceptive injection. Ask your health care provider for dietary recommendations. Contact a health care provider if:  You have nausea or vomiting.  You have abnormal vaginal discharge or bleeding.  You miss a period or you think you might be pregnant.  You experience mood changes or depression.  You feel dizzy or light-headed.  You have leg pain. Get help right away if:  You have chest pain.  You cough up blood.  You have shortness of breath.  You have a severe headache that does not go away.  You have numbness in any part of your body.  You have slurred speech.  You have vision problems.  You have vaginal bleeding that is abnormally heavy or does not stop.  You have severe pain in your abdomen.  You have depression that does not get better with treatment. If you ever feel like you may hurt yourself or others, or have thoughts about taking your own life, get help right away. You can go to your nearest emergency department or call:  Your local emergency services (911 in the [place]  A suicide crisis helpline, such as the [facility] at 3348789056. This is open 24 hours a day. Summary  A contraceptive injection is a shot that prevents pregnancy. It is also called the birth control shot.  The shot is given under the skin (subcutaneous) or into a muscle (intramuscular).  After this procedure, it is common to have  soreness around the injection site for a couple of days.  To prevent pregnancy, the shot must be given by a health care provider every 3 months (12 weeks).  After you have the shot, ask your health care provider whether you need to use an added method of birth control (backup contraception), such as a condom, sponge, or spermicide. This information is not intended to replace advice given to you by your health care provider. Make sure you discuss any questions you have with your health care provider. Document Released: 05/24/2017 Document Revised: 09/10/2017 Document Reviewed: 05/24/2017 Elsevier Patient Education  2020 [employer].

## 2019-06-16 ENCOUNTER — Ambulatory Visit: Payer: BC Managed Care – PPO | Admitting: Family Medicine

## 2019-06-16 ENCOUNTER — Encounter: Payer: Self-pay | Admitting: Family Medicine

## 2019-06-16 ENCOUNTER — Other Ambulatory Visit: Payer: Self-pay

## 2019-06-16 VITALS — BP 107/68 | HR 81 | Ht 65.0 in | Wt 156.0 lb

## 2019-06-16 DIAGNOSIS — E0590 Thyrotoxicosis, unspecified without thyrotoxic crisis or storm: Secondary | ICD-10-CM

## 2019-06-16 DIAGNOSIS — S069X1S Unspecified intracranial injury with loss of consciousness of 30 minutes or less, sequela: Secondary | ICD-10-CM | POA: Diagnosis not present

## 2019-06-16 DIAGNOSIS — F411 Generalized anxiety disorder: Secondary | ICD-10-CM | POA: Diagnosis not present

## 2019-06-16 DIAGNOSIS — E049 Nontoxic goiter, unspecified: Secondary | ICD-10-CM

## 2019-06-16 MED ORDER — ESCITALOPRAM OXALATE 10 MG PO TABS: 10.0000 mg | ORAL_TABLET | Freq: Every day | ORAL | 2 refills | Status: DC

## 2019-06-16 NOTE — Patient Instructions (Signed)
° ° ° °  If you have lab work done today you will be contacted with your lab results within the next 2 weeks.  If you have not heard from us then please contact us. The fastest way to get your results is to register for My Chart. ° ° °IF you received an x-ray today, you will receive an invoice from [facility]. Please contact [facility] at [phone] with questions or concerns regarding your invoice.  ° °IF you received labwork today, you will receive an invoice from [facility]. Please contact [facility] at [phone] with questions or concerns regarding your invoice.  ° °Our billing staff will not be able to assist you with questions regarding bills from these companies. ° °You will be contacted with the lab results as soon as they are available. The fastest way to get your results is to activate your My Chart account. Instructions are located on the last page of this paperwork. If you have not heard from us regarding the results in 2 weeks, please contact this office. °  ° ° ° °
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
## 2019-06-17 LAB — T4, FREE: Free T4: 0.93 ng/dL (ref 0.82–1.77)

## 2019-06-17 LAB — TSH: TSH: 0.723 u[IU]/mL (ref 0.450–4.500)

## 2019-07-18 ENCOUNTER — Other Ambulatory Visit: Payer: Self-pay

## 2019-07-21 ENCOUNTER — Other Ambulatory Visit: Payer: Self-pay

## 2019-07-21 DIAGNOSIS — Z20822 Contact with and (suspected) exposure to covid-19: Secondary | ICD-10-CM

## 2019-07-22 LAB — NOVEL CORONAVIRUS, NAA: SARS-CoV-2, NAA: NOT DETECTED

## 2019-08-14 ENCOUNTER — Ambulatory Visit: Payer: BC Managed Care – PPO | Admitting: Family Medicine

## 2019-09-11 ENCOUNTER — Ambulatory Visit: Payer: BC Managed Care – PPO

## 2019-09-11 ENCOUNTER — Ambulatory Visit: Payer: BC Managed Care – PPO | Admitting: Family Medicine

## 2019-09-13 ENCOUNTER — Encounter: Payer: Self-pay | Admitting: Family Medicine

## 2019-09-15 ENCOUNTER — Telehealth (INDEPENDENT_AMBULATORY_CARE_PROVIDER_SITE_OTHER): Payer: BC Managed Care – PPO | Admitting: Family Medicine

## 2019-09-15 ENCOUNTER — Other Ambulatory Visit: Payer: Self-pay

## 2019-09-15 DIAGNOSIS — U071 COVID-19: Secondary | ICD-10-CM | POA: Diagnosis not present

## 2019-09-15 NOTE — Progress Notes (Signed)
Pt is positive for covid. She contracted the virus from a coworker that was also positive of the virus. The coworker went through the quarantine process, returned bk to wk, still  positive unbeknownst to her. Due to this, she is asking to be able to stay in quarantine a little longer to slow the spread of the virus. She is having diarrhea, coughing, watery eyes, sneezing, loss of taste and smell and very fatigue.Taking Nyquil and theraflu for her sx. Out of work letter is pending.

## 2019-09-15 NOTE — Progress Notes (Signed)
   Virtual Visit Note  I connected with patient on 09/15/19 at 531pm by phone and verified that I am speaking with the correct person using two identifiers. Sabrina [name] is currently located at home and patient is currently with them during visit. The provider, Rutherford Guys, MD is located in their office at time of visit.  I discussed the limitations, risks, security and privacy concerns of performing an evaluation and management service by telephone and the availability of in person appointments. I also discussed with the patient that there may be a patient responsible charge related to this service. The patient expressed understanding and agreed to proceed.   CC: covid positive  HPI   Exposed thru work? Tested positive for covid [facility] on nov 28 Notified of positive test on dec 1st Sx come and go, worse at night Continues to have fevers, worse last night Lots of congestion, coughing Feels her hearing decreased in her left, muffled ear but no ear pain Loss of taste and smell Having intermittent dizziness SOB and body ache are bit better Good fluid intake Taking nyquil, APAP   No Known Allergies  Prior to Admission medications   Medication Sig Start Date End Date Taking? Authorizing Provider  escitalopram (LEXAPRO) 10 MG tablet Take 1 tablet (10 mg total) by mouth daily. 06/16/19   Rutherford Guys, MD    Past Medical History:  Diagnosis Date  . Anemia   . Anxiety   . Depression   . Fibroids     Past Surgical History:  Procedure Laterality Date  . MYOMECTOMY    . RECTAL EXAMINATION UNDER ANESTHESIA W/ CYSTOSCOPY      Social History   Tobacco Use  . Smoking status: Current Every Day Smoker    Types: Cigarettes    Last attempt to quit: 11/06/2014    Years since quitting: 4.8  . Smokeless tobacco: Never Used  Substance Use Topics  . Alcohol use: No    Alcohol/week: 0.0 standard drinks    Family History  Problem Relation Age of Onset  . Mental illness  Father   . Cancer Father   . Hypertension Mother   . Thyroid disease Neg Hx     ROS Per hpi  Objective  Vitals as reported by the patient: none  Gen: AAOx3, NAD Speaking in full sentences, intermittent coughing  ASSESSMENT and PLAN  1. COVID-19 Still febrile with symptoms, discussed supportive measures and ER precautions  FOLLOW-UP: 1 week   The above assessment and management plan was discussed with the patient. The patient verbalized understanding of and has agreed to the management plan. Patient is aware to call the clinic if symptoms persist or worsen. Patient is aware when to return to the clinic for a follow-up visit. Patient educated on when it is appropriate to go to the emergency department.    I provided 12 minutes of non-face-to-face time during this encounter.  Rutherford Guys, MD Primary Care at [place], [place] [address] Ph.  [phone] Fax [phone]

## 2019-09-19 ENCOUNTER — Telehealth: Payer: Self-pay

## 2019-09-19 NOTE — Telephone Encounter (Signed)
Pt. Called requesting that we fax the work note written for her to be out of work to [phone]. The note was sent to the pt. Via my chart and the pt. Has been unable to put the document in a format that she can print.

## 2019-09-19 NOTE — Telephone Encounter (Signed)
Spoke with pt advised letter printed out and faxed to [phone] attn: HR.  Confirmation received at 5:00 pm.  Advised pt will send letter via mail to home address.  Pt agreeable. Dgaddy, CMA

## 2019-09-23 ENCOUNTER — Encounter (HOSPITAL_COMMUNITY): Payer: Self-pay | Admitting: Emergency Medicine

## 2019-09-23 ENCOUNTER — Other Ambulatory Visit: Payer: Self-pay

## 2019-09-23 ENCOUNTER — Ambulatory Visit (HOSPITAL_COMMUNITY)
Admission: EM | Admit: 2019-09-23 | Discharge: 2019-09-23 | Disposition: A | Discharge status: Home / Self Care | Payer: BC Managed Care – PPO

## 2019-09-23 DIAGNOSIS — J209 Acute bronchitis, unspecified: Secondary | ICD-10-CM | POA: Diagnosis not present

## 2019-09-23 DIAGNOSIS — U071 COVID-19: Secondary | ICD-10-CM

## 2019-09-23 MED ORDER — DOXYCYCLINE HYCLATE 100 MG PO CAPS: 100.0000 mg | ORAL_CAPSULE | Freq: Two times a day (BID) | ORAL | 0 refills | Status: AC

## 2019-09-23 MED ORDER — FLUTICASONE PROPIONATE 50 MCG/ACT NA SUSP: 1.0000 | Freq: Every day | NASAL | 0 refills | Status: DC

## 2019-09-23 MED ORDER — BENZONATATE 200 MG PO CAPS: 200.0000 mg | ORAL_CAPSULE | Freq: Three times a day (TID) | ORAL | 0 refills | Status: DC | PRN

## 2019-09-23 MED ORDER — PREDNISONE 50 MG PO TABS: 50.0000 mg | ORAL_TABLET | Freq: Every day | ORAL | 0 refills | Status: AC

## 2019-09-23 MED ORDER — ALBUTEROL SULFATE HFA 108 (90 BASE) MCG/ACT IN AERS: 1.0000 | INHALATION_SPRAY | Freq: Four times a day (QID) | RESPIRATORY_TRACT | 0 refills | Status: DC | PRN

## 2019-09-23 NOTE — ED Provider Notes (Signed)
[facility]    CSN: [id number] Arrival date & time: 09/23/19  1151      History   Chief Complaint Chief Complaint  Patient presents with  . Cough    HPI Sabrina [name] is a 43 y.o. female no significant past medical history presenting today for evaluation of cough and chest congestion in relation to Covid.  Patient tested positive for Covid on 12/1.  She initially had loss of smell, diarrhea, headaches, dizziness and fever, but the symptoms have improved.  She has had persistent sinus congestion and pressure on ears, cough and chest congestion.  She has felt chest tightness.  Noticing wheezing at times.  She has been using NyQuil, Sudafed and Mucinex.  Does endorse smoking approximately 7 cigarettes/day.  Denies any persistent fevers.  HPI  Past Medical History:  Diagnosis Date  . Anemia   . Anxiety   . Depression   . Fibroids     Patient Active Problem List   Diagnosis Date Noted  . COVID-19 09/15/2019  . Hyperthyroidism 07/10/2017  . Left anterior knee pain 08/07/2016  . Fibroids 10/13/2013    Past Surgical History:  Procedure Laterality Date  . MYOMECTOMY    . RECTAL EXAMINATION UNDER ANESTHESIA W/ CYSTOSCOPY      OB History    Gravida  1   Para      Term      Preterm      AB      Living        SAB      TAB      Ectopic      Multiple      Live Births               Home Medications    Prior to Admission medications   Medication Sig Start Date End Date Taking? Authorizing Provider  guaiFENesin (MUCINEX) 600 MG 12 hr tablet Take by mouth 2 (two) times daily.   Yes [provider]  pseudoephedrine (SUDAFED) 30 MG tablet Take 30 mg by mouth every 4 (four) hours as needed for congestion.   Yes [provider]  albuterol (VENTOLIN HFA) 108 (90 Base) MCG/ACT inhaler Inhale 1-2 puffs into the lungs every 6 (six) hours as needed for wheezing or shortness of breath. 09/23/19   Shareta Fishbaugh C, PA-C   benzonatate (TESSALON) 200 MG capsule Take 1 capsule (200 mg total) by mouth 3 (three) times daily as needed for up to 7 days for cough. 09/23/19 09/30/19  Jennette Leask C, PA-C  doxycycline (VIBRAMYCIN) 100 MG capsule Take 1 capsule (100 mg total) by mouth 2 (two) times daily for 10 days. 09/23/19 10/03/19  Annya Lizana C, PA-C  escitalopram (LEXAPRO) 10 MG tablet Take 1 tablet (10 mg total) by mouth daily. Patient not taking: Reported on 09/23/2019 06/16/19   Rutherford Guys, MD  fluticasone [facility]) 50 MCG/ACT nasal spray Place 1-2 sprays into both nostrils daily for 7 days. 09/23/19 09/30/19  Khadeeja Elden C, PA-C  predniSONE (DELTASONE) 50 MG tablet Take 1 tablet (50 mg total) by mouth daily for 5 days. 09/23/19 09/28/19  Akeem Heppler, Elesa Hacker, PA-C    Family History Family History  Problem Relation Age of Onset  . Mental illness Father   . Cancer Father   . Hypertension Mother   . Thyroid disease Neg Hx     Social History Social History   Tobacco Use  . Smoking status: Current Every Day Smoker  Types: Cigarettes    Last attempt to quit: 11/06/2014    Years since quitting: 4.8  . Smokeless tobacco: Never Used  Substance Use Topics  . Alcohol use: No    Alcohol/week: 0.0 standard drinks  . Drug use: No     Allergies   Patient has no known allergies.   Review of Systems Review of Systems  Constitutional: Positive for fatigue. Negative for activity change, appetite change, chills and fever.  HENT: Positive for congestion, rhinorrhea and sinus pressure. Negative for ear pain, sore throat and trouble swallowing.   Eyes: Negative for discharge and redness.  Respiratory: Positive for cough, chest tightness and wheezing. Negative for shortness of breath.   Cardiovascular: Negative for chest pain.  Gastrointestinal: Negative for abdominal pain, diarrhea, nausea and vomiting.  Musculoskeletal: Negative for myalgias.  Skin: Negative for rash.  Neurological: Negative for  dizziness, light-headedness and headaches.     Physical Exam Triage Vital Signs ED Triage Vitals  Enc Vitals Group     BP 09/23/19 1311 110/73     Pulse Rate 09/23/19 1311 83     Resp 09/23/19 1311 18     Temp 09/23/19 1311 99.2 F (37.3 C)     Temp src --      SpO2 09/23/19 1311 99 %     Weight --      Height --      Head Circumference --      Peak Flow --      Pain Score 09/23/19 1314 0     Pain Loc --      Pain Edu? --      Excl. in [place]? --    No data found.  Updated Vital Signs BP 110/73   Pulse 83   Temp 99.2 F (37.3 C)   Resp 18   SpO2 99%   Breastfeeding Unknown   Visual Acuity Right Eye Distance:   Left Eye Distance:   Bilateral Distance:    Right Eye Near:   Left Eye Near:    Bilateral Near:     Physical Exam Vitals and nursing note reviewed.  Constitutional:      General: She is not in acute distress.    Appearance: She is well-developed.  HENT:     Head: Normocephalic and atraumatic.     Ears:     Comments: Bilateral ears without tenderness to palpation of external auricle, tragus and mastoid, EAC's without erythema or swelling, TM's with good bony landmarks and cone of light, do appear more opaque rather than pearly grey. Non erythematous.     Nose:     Comments: Nasal mucosa erythematous, swollen turbinates bilaterally    Mouth/Throat:     Comments: Oral mucosa pink and moist, no tonsillar enlargement or exudate. Posterior pharynx patent and nonerythematous, no uvula deviation or swelling. Normal phonation. Eyes:     Conjunctiva/sclera: Conjunctivae normal.  Cardiovascular:     Rate and Rhythm: Normal rate and regular rhythm.     Heart sounds: No murmur.  Pulmonary:     Effort: Pulmonary effort is normal. No respiratory distress.     Comments: Breathing comfortably at rest, occasional cough in room, coarse breath sounds noted throughout, no wheezing or rales noted Abdominal:     Palpations: Abdomen is soft.     Tenderness: There is no  abdominal tenderness.  Musculoskeletal:     Cervical back: Neck supple.  Skin:    General: Skin is warm and dry.  Neurological:  Mental Status: She is alert.      UC Treatments / Results  Labs (all labs ordered are listed, but only abnormal results are displayed) Labs Reviewed - No data to display  EKG   Radiology No results found.  Procedures Procedures (including critical care time)  Medications Ordered in UC Medications - No data to display  Initial Impression / Assessment and Plan / UC Course  I have reviewed the triage vital signs and the nursing notes.  Pertinent labs & imaging results that were available during my care of the patient were reviewed by me and considered in my medical decision making (see chart for details).     Vital signs stable, breath sounds coarse, recent Covid.  Clinical picture most consistent with bronchitis, given 2 weeks of symptoms will go ahead and cover atypicals with doxycycline, prednisone x5 days, albuterol inhaler as needed, Flonase to help with sinus congestion and pressure/eustachian tube dysfunction.  Follow-up with primary care as planned on Monday.  Continue to monitor,Discussed strict return precautions. Patient verbalized understanding and is agreeable with plan.  Final Clinical Impressions(s) / UC Diagnoses   Final diagnoses:  COVID-19  Acute bronchitis, unspecified organism     Discharge Instructions     Please begin taking doxycycline twice daily for the next 10 days. Prednisone daily with food, take in the morning if you are able Albuterol inhaler for wheezing, shortness of breath as needed every 4-6 hours May use Flonase nasal spray 1 to 2 spray in each nostril daily over the next 1 to 2 weeks May continue Mucinex, NyQuil May try Tessalon/benzonatate for cough as needed as alternative  Please continue to rest and drink plenty of fluids  Follow-up with Dr. Pamella Pert as planned on Monday  If breathing or  symptoms worsening please follow-up here or emergency room   ED Prescriptions    Medication Sig Dispense Auth. Provider   predniSONE (DELTASONE) 50 MG tablet Take 1 tablet (50 mg total) by mouth daily for 5 days. 5 tablet Terrace Fontanilla C, PA-C   albuterol (VENTOLIN HFA) 108 (90 Base) MCG/ACT inhaler Inhale 1-2 puffs into the lungs every 6 (six) hours as needed for wheezing or shortness of breath. 8 g Vannessa Godown C, PA-C   doxycycline (VIBRAMYCIN) 100 MG capsule Take 1 capsule (100 mg total) by mouth 2 (two) times daily for 10 days. 20 capsule Garon Melander C, PA-C   fluticasone (FLONASE) 50 MCG/ACT nasal spray Place 1-2 sprays into both nostrils daily for 7 days. 1 g Kasidee Voisin C, PA-C   benzonatate (TESSALON) 200 MG capsule Take 1 capsule (200 mg total) by mouth 3 (three) times daily as needed for up to 7 days for cough. 28 capsule Deni Berti, [place] C, PA-C     PDMP not reviewed this encounter.   Janith Lima, [place] 09/23/19 1446

## 2019-09-23 NOTE — ED Triage Notes (Signed)
Pt states pt started having symptoms on the 11/28, she tested positive for covid on dec 1st. Pt states she had loss of smell, diarrhea, dizziness, headache, vertigo, fever of 101.8, states most of her symptoms have resolved except for the congestion and cough

## 2019-09-23 NOTE — Discharge Instructions (Addendum)
Please begin taking doxycycline twice daily for the next 10 days. Prednisone daily with food, take in the morning if you are able Albuterol inhaler for wheezing, shortness of breath as needed every 4-6 hours May use Flonase nasal spray 1 to 2 spray in each nostril daily over the next 1 to 2 weeks May continue Mucinex, NyQuil May try Tessalon/benzonatate for cough as needed as alternative  Please continue to rest and drink plenty of fluids  Follow-up with Dr. Pamella Pert as planned on Monday  If breathing or symptoms worsening please follow-up here or emergency room

## 2019-09-24 ENCOUNTER — Telehealth: Payer: Self-pay | Admitting: Family Medicine

## 2019-09-24 NOTE — Telephone Encounter (Signed)
covid positive 12/1, initial symptoms 11/28. Now with green mucus, more cough, wheezing and dyspnea with cough. Team health nurse advised Urgent care or ER eval. Agreed with plan.

## 2019-09-25 ENCOUNTER — Other Ambulatory Visit: Payer: Self-pay

## 2019-09-25 ENCOUNTER — Telehealth (INDEPENDENT_AMBULATORY_CARE_PROVIDER_SITE_OTHER): Payer: BC Managed Care – PPO | Admitting: Family Medicine

## 2019-09-25 DIAGNOSIS — U071 COVID-19: Secondary | ICD-10-CM

## 2019-09-25 DIAGNOSIS — Z3042 Encounter for surveillance of injectable contraceptive: Secondary | ICD-10-CM | POA: Diagnosis not present

## 2019-09-25 MED ORDER — MEDROXYPROGESTERONE ACETATE 150 MG/ML IM SUSP
150.0000 mg | Freq: Once | INTRAMUSCULAR | Status: AC
  Administered 2019-09-25: 150 mg via INTRAMUSCULAR

## 2019-09-25 NOTE — Progress Notes (Signed)
Virtual Visit Note  I connected with patient on 09/25/19 at 240pm by phone and verified that I am speaking with the correct person using two identifiers. DAIJANAE ANGELOPOULOS is currently located at home and patient is currently with them during visit. The provider, Rutherford Guys, MD is located in their office at time of visit.  I discussed the limitations, risks, security and privacy concerns of performing an evaluation and management service by telephone and the availability of in person appointments. I also discussed with the patient that there may be a patient responsible charge related to this service. The patient expressed understanding and agreed to proceed.   CC: covid positive  HPI ? Tested covid positive dec 1st Seen in UC 2 days ago - bronchitis, pred, doxy and albuterol Feeling much better  lungs are cleared she is able to breathe Has more energy No fever for past 3 days She is ready to return to work  Last depo aug 31st Needs depo today, window Nov 16 - Dec 14 Happy with method  No Known Allergies  Prior to Admission medications   Medication Sig Start Date End Date Taking? Authorizing Provider  albuterol (VENTOLIN HFA) 108 (90 Base) MCG/ACT inhaler Inhale 1-2 puffs into the lungs every 6 (six) hours as needed for wheezing or shortness of breath. 09/23/19  Yes Wieters, Hallie C, PA-C  doxycycline (VIBRAMYCIN) 100 MG capsule Take 1 capsule (100 mg total) by mouth 2 (two) times daily for 10 days. 09/23/19 10/03/19 Yes Wieters, Hallie C, PA-C  predniSONE (DELTASONE) 50 MG tablet Take 1 tablet (50 mg total) by mouth daily for 5 days. 09/23/19 09/28/19 Yes Wieters, Hallie C, PA-C  benzonatate (TESSALON) 200 MG capsule Take 1 capsule (200 mg total) by mouth 3 (three) times daily as needed for up to 7 days for cough. Patient not taking: Reported on 09/25/2019 09/23/19 09/30/19  Wieters, Hallie C, PA-C  escitalopram (LEXAPRO) 10 MG tablet Take 1 tablet (10 mg total) by mouth  daily. Patient not taking: Reported on 09/23/2019 06/16/19   Rutherford Guys, MD  fluticasone [facility]) 50 MCG/ACT nasal spray Place 1-2 sprays into both nostrils daily for 7 days. Patient not taking: Reported on 09/25/2019 09/23/19 09/30/19  Janith Lima, PA-C    Past Medical History:  Diagnosis Date  . Anemia   . Anxiety   . Depression   . Fibroids     Past Surgical History:  Procedure Laterality Date  . MYOMECTOMY    . RECTAL EXAMINATION UNDER ANESTHESIA W/ CYSTOSCOPY      Social History   Tobacco Use  . Smoking status: Current Every Day Smoker    Types: Cigarettes    Last attempt to quit: 11/06/2014    Years since quitting: 4.8  . Smokeless tobacco: Never Used  Substance Use Topics  . Alcohol use: No    Alcohol/week: 0.0 standard drinks    Family History  Problem Relation Age of Onset  . Mental illness Father   . Cancer Father   . Hypertension Mother   . Thyroid disease Neg Hx     ROS Per hpi  Objective  Vitals as reported by the patient: none   ASSESSMENT and PLAN  1. COVID-19 Resolved. Able to return to work has meet quarantine guidelines  2. Depo-Provera contraceptive status - medroxyPROGESTERone (DEPO-PROVERA) injection 150 mg Next depo due Mar 1 - Mar 29  FOLLOW-UP: 3 months for nurse visit, depo   The above assessment and management plan was  discussed with the patient. The patient verbalized understanding of and has agreed to the management plan. Patient is aware to call the clinic if symptoms persist or worsen. Patient is aware when to return to the clinic for a follow-up visit. Patient educated on when it is appropriate to go to the emergency department.    I provided 8 minutes of non-face-to-face time during this encounter.  Rutherford Guys, MD Primary Care at [facility] [place], [place] [address] Ph.  [phone] Fax [phone]

## 2019-09-25 NOTE — Patient Instructions (Signed)
Next depo due Mar 1 - Mar 29

## 2019-09-25 NOTE — Progress Notes (Signed)
Pt says she is feeling good today, went to urgent care due to congestion in the lungs. She is currently on prednisone 7 day tx and doxycycline 10 day tx. She is asking to be released to go bk to work tomorrow.

## 2019-10-31 ENCOUNTER — Encounter: Payer: Self-pay | Admitting: Family Medicine

## 2019-11-02 ENCOUNTER — Telehealth: Payer: Self-pay | Admitting: Family Medicine

## 2019-11-02 NOTE — Telephone Encounter (Signed)
Patient is calling back again regarding this request FR

## 2019-11-05 ENCOUNTER — Encounter: Payer: Self-pay | Admitting: Family Medicine

## 2019-11-07 NOTE — Telephone Encounter (Signed)
Spoke to patient concerning phone message about bleeding after getting the Depo-Provera injection. Patient states she has been bleeding since 10/20/2019 and has not stopped. She checked while I was talking to her  (11/07/2019) and the bleeding is a light pink/red. Patient states this is the first time it happened and she is concerned. The patient last OV was 09/25/2019 and she got the injection at that time. I spoke to [place] (Dr Ardyth Gal) assistant and she stated the patient needs to make an appointment to see another provider here, because Dr Pamella Pert is on vacation and will not be back until Friday. I advised patient and she will do more research on [employer] before making the appointment.

## 2019-12-25 ENCOUNTER — Telehealth: Payer: Self-pay | Admitting: Family Medicine

## 2019-12-25 NOTE — Telephone Encounter (Signed)
Pt would like a callback to let her know if she is in the window for her depo shot.  She is scheduled for Tues 12/26/2019   Please advise

## 2019-12-26 ENCOUNTER — Ambulatory Visit (INDEPENDENT_AMBULATORY_CARE_PROVIDER_SITE_OTHER): Payer: BC Managed Care – PPO | Admitting: Family Medicine

## 2019-12-26 ENCOUNTER — Other Ambulatory Visit: Payer: Self-pay

## 2019-12-26 DIAGNOSIS — Z3042 Encounter for surveillance of injectable contraceptive: Secondary | ICD-10-CM

## 2019-12-26 MED ORDER — MEDROXYPROGESTERONE ACETATE 150 MG/ML IM SUSP
150.0000 mg | INTRAMUSCULAR | Status: AC
  Administered 2019-12-26: 150 mg via INTRAMUSCULAR

## 2019-12-26 NOTE — Patient Instructions (Signed)
Injection given on left side upper glut muscle. Pt given depo calender for reminder. Came in on last day time window.

## 2020-01-25 ENCOUNTER — Other Ambulatory Visit: Payer: Self-pay

## 2020-01-27 ENCOUNTER — Ambulatory Visit (HOSPITAL_COMMUNITY)
Admission: EM | Admit: 2020-01-27 | Discharge: 2020-01-27 | Disposition: A | Discharge status: Home / Self Care | Payer: BC Managed Care – PPO | Attending: Family Medicine | Admitting: Family Medicine

## 2020-01-27 ENCOUNTER — Encounter (HOSPITAL_COMMUNITY): Payer: Self-pay

## 2020-01-27 ENCOUNTER — Other Ambulatory Visit: Payer: Self-pay

## 2020-01-27 ENCOUNTER — Ambulatory Visit (INDEPENDENT_AMBULATORY_CARE_PROVIDER_SITE_OTHER): Payer: BC Managed Care – PPO

## 2020-01-27 DIAGNOSIS — R079 Chest pain, unspecified: Secondary | ICD-10-CM | POA: Diagnosis not present

## 2020-01-27 DIAGNOSIS — F17210 Nicotine dependence, cigarettes, uncomplicated: Secondary | ICD-10-CM | POA: Insufficient documentation

## 2020-01-27 DIAGNOSIS — R0602 Shortness of breath: Secondary | ICD-10-CM | POA: Insufficient documentation

## 2020-01-27 DIAGNOSIS — Z8616 Personal history of COVID-19: Secondary | ICD-10-CM | POA: Diagnosis not present

## 2020-01-27 DIAGNOSIS — R05 Cough: Secondary | ICD-10-CM

## 2020-01-27 DIAGNOSIS — Z20822 Contact with and (suspected) exposure to covid-19: Secondary | ICD-10-CM | POA: Insufficient documentation

## 2020-01-27 DIAGNOSIS — F419 Anxiety disorder, unspecified: Secondary | ICD-10-CM | POA: Diagnosis not present

## 2020-01-27 DIAGNOSIS — Z79899 Other long term (current) drug therapy: Secondary | ICD-10-CM | POA: Diagnosis not present

## 2020-01-27 DIAGNOSIS — R519 Headache, unspecified: Secondary | ICD-10-CM | POA: Insufficient documentation

## 2020-01-27 DIAGNOSIS — R059 Cough, unspecified: Secondary | ICD-10-CM

## 2020-01-27 LAB — SARS CORONAVIRUS 2 (TAT 6-24 HRS): SARS Coronavirus 2: NEGATIVE

## 2020-01-27 MED ORDER — ALBUTEROL SULFATE HFA 108 (90 BASE) MCG/ACT IN AERS: 1.0000 | INHALATION_SPRAY | Freq: Four times a day (QID) | RESPIRATORY_TRACT | 0 refills | Status: DC | PRN

## 2020-01-27 MED ORDER — CETIRIZINE HCL 10 MG PO TABS: 10.0000 mg | ORAL_TABLET | Freq: Every day | ORAL | 0 refills | Status: DC

## 2020-01-27 MED ORDER — GUAIFENESIN 100 MG/5ML PO LIQD: 100.0000 mg | ORAL | 0 refills | Status: DC | PRN

## 2020-01-27 NOTE — ED Triage Notes (Signed)
Pt states she had COVID in December, experienced SOB and CP shortly after dx and was given steroids/inhaler and symptoms improved.   States new onset SOB, productive cough, and CP squeezing in nature that increases with use of arms/movement for approx 3 weeks.   Also reports abdom pain, nausea, HA, chills. Denies v/d.  Reports had first COVID vaccine approx 2 weeks ago.

## 2020-01-27 NOTE — Discharge Instructions (Signed)
Your x ray and EKG is normal Believe this is allergy related.  Allergy medicine daily.  Albuterol inhaler as needed Cough medicine as needed.

## 2020-01-27 NOTE — ED Provider Notes (Signed)
[place]    CSN: [id number] Arrival date & time: 01/27/20  1158      History   Chief Complaint Chief Complaint  Patient presents with  . Shortness of Breath  . Chest Pain    HPI FLOWER ABURTO is a 44 y.o. female.   Patient is a 44 year old female past medical history of anxiety, anemia, depression, fibroids.  She presents today with mild shortness of breath, productive cough, chest pain that is worse with use of arms and movements.  Intermittent wheezing . This has  been present, waxing waning over the past couple weeks.  Worsened yesterday after being outside yesterday.  She is also had some headache, chills, nausea, congestion.  Patient had Covid back in December and at that time did experience shortness of breath and chest pain and was prescribed steroids and inhaler with improvement of symptoms.  She has received her first Covid injection.  No recorded fevers.  Has been taking Sudafed and massaging chest to loosen mucus.  Is a massage therapist.   ROS per HPI      Past Medical History:  Diagnosis Date  . Anemia   . Anxiety   . Depression   . Fibroids     Patient Active Problem List   Diagnosis Date Noted  . COVID-19 09/15/2019  . Hyperthyroidism 07/10/2017  . Left anterior knee pain 08/07/2016  . Fibroids 10/13/2013    Past Surgical History:  Procedure Laterality Date  . MYOMECTOMY    . RECTAL EXAMINATION UNDER ANESTHESIA W/ CYSTOSCOPY      OB History    Gravida  1   Para      Term      Preterm      AB      Living        SAB      TAB      Ectopic      Multiple      Live Births               Home Medications    Prior to Admission medications   Medication Sig Start Date End Date Taking? Authorizing Provider  loratadine (CLARITIN) 10 MG tablet Take by mouth. 01/09/19 01/27/20 Yes [provider]  albuterol (VENTOLIN HFA) 108 (90 Base) MCG/ACT inhaler Inhale 1-2 puffs into the lungs every 6 (six) hours as  needed for wheezing or shortness of breath. 01/27/20   Loura Halt A, NP  cetirizine (ZYRTEC) 10 MG tablet Take 1 tablet (10 mg total) by mouth daily. 01/27/20   Loura Halt A, NP  guaiFENesin (ROBITUSSIN) 100 MG/5ML liquid Take 5-10 mLs (100-200 mg total) by mouth every 4 (four) hours as needed for cough. 01/27/20   Ajla Mcgeachy, Tressia Miners A, NP  escitalopram (LEXAPRO) 10 MG tablet Take 1 tablet (10 mg total) by mouth daily. Patient not taking: Reported on 09/23/2019 06/16/19 01/27/20  Rutherford Guys, MD  fluticasone [facility]) 50 MCG/ACT nasal spray Place 1-2 sprays into both nostrils daily for 7 days. Patient not taking: Reported on 09/25/2019 09/23/19 01/27/20  Janith Lima, PA-C    Family History Family History  Problem Relation Age of Onset  . Mental illness Father   . Cancer Father   . Hypertension Mother   . Thyroid disease Neg Hx     Social History Social History   Tobacco Use  . Smoking status: Current Every Day Smoker    Types: Cigarettes    Last attempt to quit: 11/06/2014  Years since quitting: 5.2  . Smokeless tobacco: Never Used  Substance Use Topics  . Alcohol use: No    Alcohol/week: 0.0 standard drinks  . Drug use: No     Allergies   Patient has no known allergies.   Review of Systems Review of Systems   Physical Exam Triage Vital Signs ED Triage Vitals  Enc Vitals Group     BP 01/27/20 1312 (!) 110/97     Pulse Rate 01/27/20 1312 80     Resp 01/27/20 1312 18     Temp 01/27/20 1312 98.3 F (36.8 C)     Temp Source 01/27/20 1312 Oral     SpO2 01/27/20 1312 98 %     Weight --      Height --      Head Circumference --      Peak Flow --      Pain Score 01/27/20 1307 3     Pain Loc --      Pain Edu? --      Excl. in [place]? --    No data found.  Updated Vital Signs BP (!) 110/97 (BP Location: Right Arm)   Pulse 80   Temp 98.3 F (36.8 C) (Oral)   Resp 18   SpO2 98%   Visual Acuity Right Eye Distance:   Left Eye Distance:   Bilateral Distance:      Right Eye Near:   Left Eye Near:    Bilateral Near:     Physical Exam Vitals and nursing note reviewed.  Constitutional:      General: She is not in acute distress.    Appearance: Normal appearance. She is not ill-appearing, toxic-appearing or diaphoretic.  HENT:     Head: Normocephalic and atraumatic.     Nose: Nose normal.     Mouth/Throat:     Pharynx: Oropharynx is clear.  Eyes:     Conjunctiva/sclera: Conjunctivae normal.  Cardiovascular:     Rate and Rhythm: Normal rate and regular rhythm.     Pulses: Normal pulses.     Heart sounds: Normal heart sounds.  Pulmonary:     Effort: Pulmonary effort is normal.     Breath sounds: Normal breath sounds.  Musculoskeletal:        General: Normal range of motion.     Cervical back: Normal range of motion.  Skin:    General: Skin is warm and dry.  Neurological:     Mental Status: She is alert.  Psychiatric:        Mood and Affect: Mood normal.      UC Treatments / Results  Labs (all labs ordered are listed, but only abnormal results are displayed) Labs Reviewed  SARS CORONAVIRUS 2 (TAT 6-24 HRS)    EKG   Radiology DG Chest 2 View  Result Date: 01/27/2020 CLINICAL DATA:  Cough, chest pain. EXAM: CHEST - 2 VIEW COMPARISON:  November 11, 2011. FINDINGS: The heart size and mediastinal contours are within normal limits. Both lungs are clear. No pneumothorax or pleural effusion is noted. The visualized skeletal structures are unremarkable. IMPRESSION: No active cardiopulmonary disease. Electronically Signed   By: Marijo Conception M.D.   On: 01/27/2020 13:36    Procedures Procedures (including critical care time)  Medications Ordered in UC Medications - No data to display  Initial Impression / Assessment and Plan / UC Course  I have reviewed the triage vital signs and the nursing notes.  Pertinent labs & imaging results that  were available during my care of the patient were reviewed by me and considered in my medical  decision making (see chart for details).     Cough X-ray negative for any acute abnormalities. EKG with normal sinus rhythm normal rate. Exam benign Vital signs stable and she is nontoxic or ill-appearing. Believe most likely her symptoms are allergy related. Most likely some allergy induced asthma/bronchitis. Lungs clear on exam today. We will have her use albuterol inhaler as needed for cough, wheezing or shortness of breath. Zyrtec daily for antihistamine And guaifenesin for cough and mucus Work note given Follow up as needed for continued or worsening symptoms   Final Clinical Impressions(s) / UC Diagnoses   Final diagnoses:  Cough     Discharge Instructions     Your x ray and EKG is normal Believe this is allergy related.  Allergy medicine daily.  Albuterol inhaler as needed Cough medicine as needed.     ED Prescriptions    Medication Sig Dispense Auth. Provider   albuterol (VENTOLIN HFA) 108 (90 Base) MCG/ACT inhaler Inhale 1-2 puffs into the lungs every 6 (six) hours as needed for wheezing or shortness of breath. 8 g Kevyn Wengert A, NP   cetirizine (ZYRTEC) 10 MG tablet Take 1 tablet (10 mg total) by mouth daily. 30 tablet Jalessa Peyser A, NP   guaiFENesin (ROBITUSSIN) 100 MG/5ML liquid Take 5-10 mLs (100-200 mg total) by mouth every 4 (four) hours as needed for cough. 60 mL Georjean Toya A, NP     PDMP not reviewed this encounter.   Orvan July, NP 01/27/20 1514

## 2020-01-29 ENCOUNTER — Encounter: Payer: Self-pay | Admitting: Endocrinology

## 2020-01-29 ENCOUNTER — Ambulatory Visit (INDEPENDENT_AMBULATORY_CARE_PROVIDER_SITE_OTHER): Payer: BC Managed Care – PPO | Admitting: Endocrinology

## 2020-01-29 ENCOUNTER — Other Ambulatory Visit: Payer: Self-pay

## 2020-01-29 VITALS — BP 126/70 | HR 60 | Ht 65.0 in | Wt 160.0 lb

## 2020-01-29 DIAGNOSIS — E0590 Thyrotoxicosis, unspecified without thyrotoxic crisis or storm: Secondary | ICD-10-CM | POA: Diagnosis not present

## 2020-01-29 LAB — TSH: TSH: 0.82 u[IU]/mL (ref 0.35–4.50)

## 2020-01-29 LAB — T4, FREE: Free T4: 0.9 ng/dL (ref 0.60–1.60)

## 2020-01-29 NOTE — Progress Notes (Signed)
Subjective:    Patient ID: Sabrina [name], female    DOB: 01-May-1976, 44 y.o.   MRN: [id number]  HPI Pt returns for f/u of hyperthyroidism (dx'ed 2018; she has never had thyroid imaging, but PE shows right thyroid mass; she says she is not at risk for pregnancy; she has sxs despite mild TSH abnormality, so tapazole was rx'ed; tapazole was d/c'ed in 2019, due to normal TSH).   pt states she feels well in general.   Past Medical History:  Diagnosis Date  . Anemia   . Anxiety   . Depression   . Fibroids     Past Surgical History:  Procedure Laterality Date  . MYOMECTOMY    . RECTAL EXAMINATION UNDER ANESTHESIA W/ CYSTOSCOPY      Social History   Socioeconomic History  . Marital status: Single    Spouse name: Not on file  . Number of children: 0  . Years of education: Not on file  . Highest education level: Not on file  Occupational History  . Not on file  Tobacco Use  . Smoking status: Current Every Day Smoker    Types: Cigarettes    Last attempt to quit: 11/06/2014    Years since quitting: 5.2  . Smokeless tobacco: Never Used  Substance and Sexual Activity  . Alcohol use: No    Alcohol/week: 0.0 standard drinks  . Drug use: No  . Sexual activity: Yes  Other Topics Concern  . Not on file  Social History Narrative   Massage therapist   Social Determinants of Health   Financial Resource Strain:   . Difficulty of Paying Living Expenses:   Food Insecurity:   . Worried About Charity fundraiser in the Last Year:   . Arboriculturist in the Last Year:   Transportation Needs:   . Film/video editor (Medical):   Marland Kitchen Lack of Transportation (Non-Medical):   Physical Activity:   . Days of Exercise per Week:   . Minutes of Exercise per Session:   Stress:   . Feeling of Stress :   Social Connections:   . Frequency of Communication with Friends and Family:   . Frequency of Social Gatherings with Friends and Family:   . Attends Religious Services:   . Active  Member of Clubs or Organizations:   . Attends Archivist Meetings:   Marland Kitchen Marital Status:   Intimate Partner Violence:   . Fear of Current or Ex-Partner:   . Emotionally Abused:   Marland Kitchen Physically Abused:   . Sexually Abused:     Current Outpatient Medications on File Prior to Visit  Medication Sig Dispense Refill  . albuterol (VENTOLIN HFA) 108 (90 Base) MCG/ACT inhaler Inhale 1-2 puffs into the lungs every 6 (six) hours as needed for wheezing or shortness of breath. 8 g 0  . cetirizine (ZYRTEC) 10 MG tablet Take 1 tablet (10 mg total) by mouth daily. 30 tablet 0  . guaiFENesin (ROBITUSSIN) 100 MG/5ML liquid Take 5-10 mLs (100-200 mg total) by mouth every 4 (four) hours as needed for cough. 60 mL 0  . [DISCONTINUED] escitalopram (LEXAPRO) 10 MG tablet Take 1 tablet (10 mg total) by mouth daily. (Patient not taking: Reported on 09/23/2019) 30 tablet 2  . [DISCONTINUED] fluticasone (FLONASE) 50 MCG/ACT nasal spray Place 1-2 sprays into both nostrils daily for 7 days. (Patient not taking: Reported on 09/25/2019) 1 g 0  . [DISCONTINUED] loratadine (CLARITIN) 10 MG tablet Take by mouth.  Current Facility-Administered Medications on File Prior to Visit  Medication Dose Route Frequency Provider Last Rate Last Admin  . medroxyPROGESTERone (DEPO-PROVERA) injection 150 mg  150 mg Intramuscular Q90 days Pamella Pert, Irma M, MD   150 mg at 12/26/19 1211    No Known Allergies  Family History  Problem Relation Age of Onset  . Mental illness Father   . Cancer Father   . Hypertension Mother   . Thyroid disease Neg Hx     BP 126/70   Pulse 60   Ht 5\' 5"  (1.651 m)   Wt 160 lb (72.6 kg)   SpO2 99%   BMI 26.63 kg/m    Review of Systems she denies palpitations and tremor.       Objective:   Physical Exam VITAL SIGNS:  See vs page GENERAL: no distress NECK: right thyroid is 10 times normal size.  Right thyroid is normal.   Lab Results  Component Value Date   TSH 0.82 01/29/2020       Assessment & Plan:  Hyperthyroidism: in remission off rx, but she is at high rick for recurrence Goiter: worse  Patient Instructions  Blood tests are requested for you today.  We'll let you know about the results.   You should consider having the ultrasound.  Please think it over, and let me know.   Please come back for a follow-up appointment in 6 months.

## 2020-01-29 NOTE — Patient Instructions (Addendum)
Blood tests are requested for you today.  We'll let you know about the results.   You should consider having the ultrasound.  Please think it over, and let me know.   Please come back for a follow-up appointment in 6 months.

## 2020-02-08 ENCOUNTER — Other Ambulatory Visit: Payer: Self-pay | Admitting: Family Medicine

## 2020-02-08 NOTE — Telephone Encounter (Signed)
No action needed

## 2020-03-28 NOTE — Progress Notes (Signed)
 Patient was reached and results were delivered.

## 2020-06-14 ENCOUNTER — Encounter: Payer: Self-pay | Admitting: Family Medicine

## 2020-06-14 ENCOUNTER — Ambulatory Visit (INDEPENDENT_AMBULATORY_CARE_PROVIDER_SITE_OTHER): Payer: Self-pay | Admitting: Family Medicine

## 2020-06-14 ENCOUNTER — Other Ambulatory Visit: Payer: Self-pay

## 2020-06-14 VITALS — BP 104/71 | HR 74 | Temp 98.3°F | Ht 65.0 in | Wt 157.0 lb

## 2020-06-14 DIAGNOSIS — Z720 Tobacco use: Secondary | ICD-10-CM

## 2020-06-14 DIAGNOSIS — N912 Amenorrhea, unspecified: Secondary | ICD-10-CM

## 2020-06-14 DIAGNOSIS — N926 Irregular menstruation, unspecified: Secondary | ICD-10-CM

## 2020-06-14 DIAGNOSIS — E049 Nontoxic goiter, unspecified: Secondary | ICD-10-CM

## 2020-06-14 DIAGNOSIS — R059 Cough, unspecified: Secondary | ICD-10-CM

## 2020-06-14 DIAGNOSIS — D251 Intramural leiomyoma of uterus: Secondary | ICD-10-CM

## 2020-06-14 DIAGNOSIS — R05 Cough: Secondary | ICD-10-CM

## 2020-06-14 LAB — POCT URINE PREGNANCY: Preg Test, Ur: NEGATIVE

## 2020-06-14 MED ORDER — ALBUTEROL SULFATE HFA 108 (90 BASE) MCG/ACT IN AERS: 2.0000 | INHALATION_SPRAY | RESPIRATORY_TRACT | 1 refills | Status: DC | PRN

## 2020-06-14 NOTE — Patient Instructions (Addendum)
   Referral is being made to a gynecologist to evaluate your uterine fibroids.  I think it would be a good idea to get them evaluated before you became pregnant.  Certainly if you are already pregnant, that is fine.  But we will try and get you in as soon as we can.  The book I suggested is:  When Infertility Books Are Not Enough: Embracing Hope During Infertility by Johney Maine, available on [facility]  Pregnancy test negative today.    If you have lab work done today you will be contacted with your lab results within the next 2 weeks.  If you have not heard from [place] then please contact us. The fastest way to get your results is to register for My Chart.   IF you received an x-ray today, you will receive an invoice from [facility]. Please contact [facility] at [phone] with questions or concerns regarding your invoice.   IF you received labwork today, you will receive an invoice from [facility]. Please contact [facility] at [phone] with questions or concerns regarding your invoice.   Our billing staff will not be able to assist you with questions regarding bills from these companies.  You will be contacted with the lab results as soon as they are available. The fastest way to get your results is to activate your My Chart account. Instructions are located on the last page of this paperwork. If you have not heard from [place] regarding the results in 2 weeks, please contact this office.

## 2020-06-14 NOTE — Progress Notes (Signed)
Patient ID: Sabrina [name], female    DOB: Jun 19, 1976  Age: 44 y.o. MRN: [id number]  Chief Complaint  Patient presents with   Cough   Amenorrhea    missed cycle- Preg test Neg. Would like blood test to be sure    Subjective:   Patient is here because of 2 things.  She was concerned about her amenorrhea.  She had a Depo shot in March and no menstrual cycle since July.  She is checked a couple pregnancy tests which have been negative.  She has been gravida 2 para 0 AB 2.  She also complains of oral cough and little please.  She has been smoking since she was a teenager.  She is only smoke about 6 to 7 cigarettes a day.  She works as a Transport planner at [employer].  She had uterine fibroid surgery about 6 years ago.  Current allergies, medications, problem list, past/family and social histories reviewed.  Objective:  BP 104/71    Pulse 74    Temp 98.3 F (36.8 C) (Temporal)    Ht 5\' 5"  (1.651 m)    Wt 157 lb (71.2 kg)    LMP 04/25/2020    SpO2 96%    BMI 26.13 kg/m   Throat clear.  Neck supple.  Has thyromegaly, a goiter which is followed by an endocrine.  Chest clear to auscultation.  Abdomen soft.  Cannot feel the uterus just on abdominal palpation.  In looking at her ultrasound she had a couple of years ago when she had an abortion, she did have a 4 cm uterine fibroid and it looks like multiple fibroids though she was not told of this.  Assessment & Plan:   Assessment: 1. Missed period   2. Amenorrhea   3. Intramural leiomyoma of uterus   4. Cough   5. Tobacco abuse   6. Goiter       Plan: Refer to gynecology.  She already has an endocrinologist who follows her goiter.  I had failed a note about the cough until she was on the way out, and we talked about that when called in an albuterol for her which is not our discharge instruction sheet but she knows about it..  I told her emphatically that she needs to stop smoking before she gets pregnant.  See instructions.  Orders Placed This  Encounter  Procedures   Ambulatory referral to Obstetrics / Gynecology    Referral Priority:   Routine    Referral Type:   Consultation    Referral Reason:   Specialty Services Required    Requested Specialty:   Obstetrics and Gynecology    Number of Visits Requested:   1   POCT urine pregnancy    Meds ordered this encounter  Medications   albuterol (VENTOLIN HFA) 108 (90 Base) MCG/ACT inhaler    Sig: Inhale 2 puffs into the lungs every 4 (four) hours as needed for wheezing or shortness of breath (cough, shortness of breath or wheezing.).    Dispense:  1 each    Refill:  1         Patient Instructions     Referral is being made to a gynecologist to evaluate your uterine fibroids.  I think it would be a good idea to get them evaluated before you became pregnant.  Certainly if you are already pregnant, that is fine.  But we will try and get you in as soon as we can.  The book I suggested is:  When Infertility Books Are Not Enough: Embracing Hope During Infertility by Johney Maine, available on [facility]  Pregnancy test negative today.    If you have lab work done today you will be contacted with your lab results within the next 2 weeks.  If you have not heard from [place] then please contact us. The fastest way to get your results is to register for My Chart.   IF you received an x-ray today, you will receive an invoice from [facility]. Please contact [facility] at [phone] with questions or concerns regarding your invoice.   IF you received labwork today, you will receive an invoice from [facility]. Please contact [facility] at [phone] with questions or concerns regarding your invoice.   Our billing staff will not be able to assist you with questions regarding bills from these companies.  You will be contacted with the lab results as soon as they are available. The fastest way to get your results is to activate your My Chart account. Instructions are  located on the last page of this paperwork. If you have not heard from [place] regarding the results in 2 weeks, please contact this office.         Return if symptoms worsen or fail to improve.   Ruben Reason, MD 06/14/2020

## 2020-06-14 NOTE — Progress Notes (Signed)
Patient ID: Sabrina [name], female    DOB: July 23, 1976  Age: 44 y.o. MRN: [id number]  Patient ID: Sabrina [name], female    DOB: [birth year]  Age: 44 y.o. MRN: [id number]  Chief Complaint  Patient presents with  . Cough  . Amenorrhea    missed cycle- Preg test Neg. Would like blood test to be sure    Subjective:   Somehow my note disappeared.  This is a 44 year old lady who came in complaining about amenorrhea.  Her last pregnancy tests at home was negative.  She had Depo-Provera in March, menstrual cycle in July, none since.  A couple of home pregnancy tests are negative.  She feels full in her breasts and think she is pregnant possibly.  Also complains of a little of a chronic cough.  Apparently she smokes, has done since she was a teenager.  She smokes 7 cigarettes a day.  She is anxious about wanting to get pregnant.  She had a uterine fibroid 6 years ago removed surgically.  Ultrasounds when she had an abortion 2 years ago showed a 4 cm fibroid and some other fibroids, but she was not told about that apparently.  Current allergies, medications, problem list, past/family and social histories reviewed.  Objective:  BP 104/71   Pulse 74   Temp 98.3 F (36.8 C) (Temporal)   Ht 5\' 5"  (1.651 m)   Wt 157 lb (71.2 kg)   LMP 04/25/2020   SpO2 96%   BMI 26.13 kg/m   No acute distress.  Throat clear.  Neck supple.  Has a moderate size goiter that is followed by an endocrinologist.  Chest clear.  Heart regular without murmur.  Abdomen soft, lower abdomen does not feel particularly full. Assessment & Plan:   Assessment: 1. Missed period   2. Amenorrhea   3. Intramural leiomyoma of uterus   4. Cough   5. Tobacco abuse   6. Goiter       Plan: See instructions.  After I had done the instructions I was told of the cough, so prescribed albuterol inhaler for as needed use and talk to her about the absolute necessity of stopping smoking before getting pregnant.  That is not in  the discharge instructions.  She was told that.  Offered a serum pregnancy test but she declined. Orders Placed This Encounter  Procedures  . Ambulatory referral to Obstetrics / Gynecology    Referral Priority:   Routine    Referral Type:   Consultation    Referral Reason:   Specialty Services Required    Requested Specialty:   Obstetrics and Gynecology    Number of Visits Requested:   1  . POCT urine pregnancy    Meds ordered this encounter  Medications  . albuterol (VENTOLIN HFA) 108 (90 Base) MCG/ACT inhaler    Sig: Inhale 2 puffs into the lungs every 4 (four) hours as needed for wheezing or shortness of breath (cough, shortness of breath or wheezing.).    Dispense:  1 each    Refill:  1         Patient Instructions     Referral is being made to a gynecologist to evaluate your uterine fibroids.  I think it would be a good idea to get them evaluated before you became pregnant.  Certainly if you are already pregnant, that is fine.  But we will try and get you in as soon as we can.  The book I suggested is:  When Infertility  Books Are Not Enough: Embracing Hope During Infertility by Johney Maine, available on [facility]  Pregnancy test negative today.    If you have lab work done today you will be contacted with your lab results within the next 2 weeks.  If you have not heard from [place] then please contact us. The fastest way to get your results is to register for My Chart.   IF you received an x-ray today, you will receive an invoice from [facility]. Please contact [facility]iology at [phone] with questions or concerns regarding your invoice.   IF you received labwork today, you will receive an invoice from [place]. Please contact [facility] at [phone] with questions or concerns regarding your invoice.   Our billing staff will not be able to assist you with questions regarding bills from these companies.  You will be contacted with the lab results as  soon as they are available. The fastest way to get your results is to activate your My Chart account. Instructions are located on the last page of this paperwork. If you have not heard from [place] regarding the results in 2 weeks, please contact this office.         Return if symptoms worsen or fail to improve.   Ruben Reason, MD 06/14/2020

## 2020-07-09 ENCOUNTER — Other Ambulatory Visit: Payer: Self-pay | Admitting: Obstetrics and Gynecology

## 2020-07-09 DIAGNOSIS — Z1231 Encounter for screening mammogram for malignant neoplasm of breast: Secondary | ICD-10-CM

## 2020-07-10 ENCOUNTER — Other Ambulatory Visit: Payer: Self-pay | Admitting: Family Medicine

## 2020-07-10 DIAGNOSIS — R059 Cough, unspecified: Secondary | ICD-10-CM

## 2020-07-10 NOTE — Telephone Encounter (Signed)
.   Requested Prescriptions  Pending Prescriptions Disp Refills  . albuterol (VENTOLIN HFA) 108 (90 Base) MCG/ACT inhaler [Pharmacy Med Name: ALBUTEROL HFA INH (200 PUFFS)8.5GM] 8.5 g 1    Sig: INHALE 2 PUFFS INTO THE LUNGS EVERY 4 HOURS AS NEEDED FOR WHEEZING OR SHORTNESS OF BREATH OR COUGH     Pulmonology:  Beta Agonists Failed - 07/10/2020  3:35 AM      Failed - One inhaler should last at least one month. If the patient is requesting refills earlier, contact the patient to check for uncontrolled symptoms.      Passed - Valid encounter within last 12 months    Recent Outpatient Visits          3 weeks ago Missed period   [facility], Fenton Malling, MD   6 months ago Depo-Provera contraceptive status   [facility], Lilia Argue, MD   9 months ago COVID-19   [facility], Lilia Argue, MD   9 months ago COVID-19   [facility], Lilia Argue, MD   1 year ago Traumatic brain injury, with loss of consciousness of 30 minutes or less, sequela [facility])   [facility], Lilia Argue, MD

## 2020-07-29 ENCOUNTER — Ambulatory Visit: Payer: BC Managed Care – PPO | Admitting: Endocrinology

## 2020-08-21 ENCOUNTER — Other Ambulatory Visit: Payer: Self-pay | Admitting: Radiology

## 2020-09-12 ENCOUNTER — Ambulatory Visit: Payer: Self-pay

## 2020-11-27 NOTE — Progress Notes (Signed)
 Self Swab Type: Anterior Nasal

## 2021-02-07 ENCOUNTER — Other Ambulatory Visit: Payer: Self-pay

## 2021-02-07 DIAGNOSIS — M79622 Pain in left upper arm: Secondary | ICD-10-CM

## 2021-02-07 DIAGNOSIS — N644 Mastodynia: Secondary | ICD-10-CM

## 2021-02-27 ENCOUNTER — Other Ambulatory Visit: Payer: Self-pay

## 2021-02-27 ENCOUNTER — Ambulatory Visit: Payer: No Typology Code available for payment source | Admitting: *Deleted

## 2021-02-27 ENCOUNTER — Ambulatory Visit: Payer: Self-pay

## 2021-02-27 ENCOUNTER — Ambulatory Visit
Admission: RE | Admit: 2021-02-27 | Discharge: 2021-02-27 | Disposition: A | Discharge status: Home / Self Care | Payer: No Typology Code available for payment source | Source: Ambulatory Visit | Attending: Obstetrics and Gynecology | Admitting: Obstetrics and Gynecology

## 2021-02-27 VITALS — BP 114/72 | Wt 157.8 lb

## 2021-02-27 DIAGNOSIS — M79622 Pain in left upper arm: Secondary | ICD-10-CM

## 2021-02-27 DIAGNOSIS — Z1239 Encounter for other screening for malignant neoplasm of breast: Secondary | ICD-10-CM

## 2021-02-27 DIAGNOSIS — N644 Mastodynia: Secondary | ICD-10-CM

## 2021-02-27 NOTE — Progress Notes (Signed)
[name] is a 45 y.o. female who presents to [facility] clinic today with complaint of bilateral breast pain around the nipple area, left outer breast pain, and left axillary pain x one month. Patient states the pain comes and goes. Patient stated the pain was constant and decreased one week ago. Patient rates the pain at a 5 out of 10. Patient stated she had a left breast lump a month ago that resolved one week ago.    Pap Smear: Pap smear not completed today. Last Pap smear was in March 2022 at Dr. Vonna Kotyk clinic and was normal per patient. Per patient has no history of an abnormal Pap smear. Last Pap smear result is not available in Epic. Previous Pap smear 09/01/2018 is in [place].   Physical exam: Breasts Breasts symmetrical. No skin abnormalities bilateral breasts. No nipple retraction bilateral breasts. No nipple discharge bilateral breasts. No lymphadenopathy. No lumps palpated bilateral breasts. Complaints of bilateral nipple area pain on exam. Complaints of left upper outer breast and left axillary pain on exam.       Pelvic/Bimanual Pap is not indicated today per BCCCP guidelines.   Smoking History: Patient stated she is a former smoker that quit in 2021 and began vaping. Discussed smoking cessation and vaping risks with patient. Referred to the [facility] Quitline and gave resources to the free smoking cessation classes at [facility].   Patient Navigation: Patient education provided. Access to services provided for patient through BCCCP program.   Breast and Cervical Cancer Risk Assessment: Patient does not have family history of breast cancer, known genetic mutations, or radiation treatment to the chest before age 66. Patient does not have history of cervical dysplasia, immunocompromised, or DES exposure in-utero.  Risk Assessment    Risk Scores      02/27/2021   Last edited by: Demetrius Revel, LPN   5-year risk: 0.7 %   Lifetime risk: 9.5 %          A: BCCCP exam  without pap smear Complaint of bilateral breast pain, left outer breast pain, and left axillary pain.  P: Referred patient to the [facility] for a diagnostic mammogram. Appointment scheduled Thursday, Feb 27, 2021 at 1010.   Loletta Parish, RN 02/27/2021 9:09 AM

## 2021-02-27 NOTE — Patient Instructions (Signed)
Explained breast self awareness with [name]. Patient did not need a Pap smear today due to last Pap smear was in March 2022 per patient. Let her know BCCCP will cover Pap smears every 3 years unless has a history of abnormal Pap smears. Referred patient to the [facility] for a diagnostic mammogram. Appointment scheduled Thursday, Feb 27, 2021 at 1010. Patient aware of appointment and will be there. Let patient know [facility] will follow up with her within the next couple weeks with results of her mammogram by letter or phone. Discussed smoking cessation and vaping risks with patient. Referred to the [facility] Quitline and gave resources to the free smoking cessation classes at [facility]. Sabrina [name] verbalized understanding.  Aidian Salomon, Arvil Chaco, RN 9:09 AM

## 2021-04-03 ENCOUNTER — Emergency Department (HOSPITAL_COMMUNITY): Payer: No Typology Code available for payment source

## 2021-04-03 ENCOUNTER — Other Ambulatory Visit: Payer: Self-pay

## 2021-04-03 ENCOUNTER — Encounter (HOSPITAL_COMMUNITY): Payer: Self-pay | Admitting: *Deleted

## 2021-04-03 ENCOUNTER — Emergency Department (HOSPITAL_COMMUNITY)
Admission: EM | Admit: 2021-04-03 | Discharge: 2021-04-03 | Disposition: A | Discharge status: Home / Self Care | Payer: No Typology Code available for payment source | Attending: Emergency Medicine | Admitting: Emergency Medicine

## 2021-04-03 DIAGNOSIS — R0602 Shortness of breath: Secondary | ICD-10-CM | POA: Insufficient documentation

## 2021-04-03 DIAGNOSIS — Z8616 Personal history of COVID-19: Secondary | ICD-10-CM | POA: Insufficient documentation

## 2021-04-03 DIAGNOSIS — R079 Chest pain, unspecified: Secondary | ICD-10-CM

## 2021-04-03 DIAGNOSIS — F17210 Nicotine dependence, cigarettes, uncomplicated: Secondary | ICD-10-CM | POA: Insufficient documentation

## 2021-04-03 DIAGNOSIS — E079 Disorder of thyroid, unspecified: Secondary | ICD-10-CM | POA: Diagnosis not present

## 2021-04-03 LAB — CBC
HCT: 40 % (ref 36.0–46.0)
Hemoglobin: 13 g/dL (ref 12.0–15.0)
MCH: 30.6 pg (ref 26.0–34.0)
MCHC: 32.5 g/dL (ref 30.0–36.0)
MCV: 94.1 fL (ref 80.0–100.0)
Platelets: 287 10*3/uL (ref 150–400)
RBC: 4.25 MIL/uL (ref 3.87–5.11)
RDW: 12.7 % (ref 11.5–15.5)
WBC: 9.2 10*3/uL (ref 4.0–10.5)
nRBC: 0 % (ref 0.0–0.2)

## 2021-04-03 LAB — BASIC METABOLIC PANEL WITH GFR
Anion gap: 7 (ref 5–15)
BUN: 9 mg/dL (ref 6–20)
CO2: 26 mmol/L (ref 22–32)
Calcium: 9 mg/dL (ref 8.9–10.3)
Chloride: 106 mmol/L (ref 98–111)
Creatinine, Ser: 0.79 mg/dL (ref 0.44–1.00)
GFR, Estimated: >60 mL/min (ref >60)
Glucose, Bld: 94 mg/dL (ref 70–99)
Potassium: 3.6 mmol/L (ref 3.5–5.1)
Sodium: 139 mmol/L (ref 135–145)

## 2021-04-03 LAB — I-STAT BETA HCG BLOOD, ED (MC, WL, AP ONLY): I-stat hCG, quantitative: <5 m[IU]/mL (ref <5)

## 2021-04-03 LAB — TSH: TSH: 0.711 u[IU]/mL (ref 0.350–4.500)

## 2021-04-03 LAB — D-DIMER, QUANTITATIVE: D-Dimer, Quant: <0.27 ug{FEU}/mL (ref 0.00–0.50)

## 2021-04-03 LAB — TROPONIN I (HIGH SENSITIVITY)
Troponin I (High Sensitivity): <2 ng/L (ref <18)
Troponin I (High Sensitivity): <2 ng/L (ref <18)

## 2021-04-03 MED ORDER — IBUPROFEN 200 MG PO TABS
600.0000 mg | ORAL_TABLET | Freq: Once | ORAL | Status: AC
  Administered 2021-04-03: 600 mg via ORAL
  Filled 2021-04-03: qty 3

## 2021-04-03 MED ORDER — IOHEXOL 300 MG/ML  SOLN
75.0000 mL | Freq: Once | INTRAMUSCULAR | Status: AC | PRN
  Administered 2021-04-03: 75 mL via INTRAVENOUS

## 2021-04-03 NOTE — ED Triage Notes (Signed)
Pt complains of chest pain, sob since last night. Pain 6/10. Symptoms improve when taking her mask down.

## 2021-04-03 NOTE — ED Provider Notes (Signed)
[facility] DEPT Provider Note   CSN: [id number] Arrival date & time: 04/03/21  9935     History Chief Complaint  Patient presents with   Chest Pain    Sabrina [name] is a 45 y.o. female.  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  She has a history of hyperthyroidism and is not currently on medication.  The history is provided by the patient.  Chest Pain Pain location:  R chest Pain quality: sharp   Pain radiates to:  Does not radiate Pain severity:  No pain Onset quality:  Sudden Duration:  18 hours Timing:  Constant Progression:  Unchanged Chronicity:  New Context: at rest   Relieved by:  Nothing Worsened by:  Deep breathing Ineffective treatments:  None tried Associated symptoms: shortness of breath   Associated symptoms: no abdominal pain, no back pain, no cough, no fever, no lower extremity edema, no palpitations and no vomiting       Past Medical History:  Diagnosis Date   Anemia    Anxiety    Depression    Fibroids     Patient Active Problem List   Diagnosis Date Noted   COVID-19 09/15/2019   Hyperthyroidism 07/10/2017   Left anterior knee pain 08/07/2016   Fibroids 10/13/2013    Past Surgical History:  Procedure Laterality Date   MYOMECTOMY     RECTAL EXAMINATION UNDER ANESTHESIA W/ CYSTOSCOPY       OB History     Gravida  1   Para      Term      Preterm      AB      Living         SAB      IAB      Ectopic      Multiple      Live Births              Family History  Problem Relation Age of Onset   Mental illness Father    Cancer Father    Hypertension Mother    Thyroid disease Neg Hx     Social History   Tobacco Use   Smoking status: Every Day    Pack years: 0.00    Types: Cigarettes    Last attempt to quit: 11/06/2014    Years since quitting: 6.4   Smokeless tobacco: Never  Vaping Use    Vaping Use: Every day  Substance Use Topics   Alcohol use: No    Alcohol/week: 0.0 standard drinks   Drug use: No    Home Medications Prior to Admission medications   Medication Sig Start Date End Date Taking? Authorizing Provider  albuterol (VENTOLIN HFA) 108 (90 Base) MCG/ACT inhaler INHALE 2 PUFFS INTO THE LUNGS EVERY 4 HOURS AS NEEDED FOR WHEEZING OR SHORTNESS OF BREATH OR COUGH 07/10/20   Jacelyn Pi, Lilia Argue, MD  Multiple Vitamin (MULTIVITAMIN) tablet Take 1 tablet by mouth daily. [facility]    [provider]  escitalopram (LEXAPRO) 10 MG tablet Take 1 tablet (10 mg total) by mouth daily. Patient not taking: Reported on 09/23/2019 06/16/19 01/27/20  Jacelyn Pi, Lilia Argue, MD  fluticasone [facility]) 50 MCG/ACT nasal spray Place 1-2 sprays into both nostrils daily for 7 days. Patient not taking: Reported on 09/25/2019 09/23/19 01/27/20  Wieters, Hallie C, PA-C  loratadine (CLARITIN) 10 MG tablet Take by mouth. 01/09/19 01/27/20  [provider]    Allergies    Patient has no known allergies.  Review of Systems   Review of Systems  Constitutional:  Negative for chills and fever.  HENT:  Negative for ear pain and sore throat.   Eyes:  Negative for pain and visual disturbance.  Respiratory:  Positive for shortness of breath. Negative for cough.   Cardiovascular:  Positive for chest pain. Negative for palpitations.  Gastrointestinal:  Negative for abdominal pain and vomiting.  Genitourinary:  Negative for dysuria and hematuria.  Musculoskeletal:  Negative for arthralgias and back pain.  Skin:  Negative for color change and rash.  Neurological:  Negative for seizures and syncope.  All other systems reviewed and are negative.  Physical Exam Updated Vital Signs BP 106/74   Pulse (!) 53   Temp 97.8 F (36.6 C) (Oral)   Resp 17   SpO2 100%   Physical Exam Vitals and nursing note reviewed.  Constitutional:      General: She is not in acute distress.     Appearance: She is well-developed.     Comments: Calm, comfortable, handling all secretions  HENT:     Head: Normocephalic and atraumatic.  Eyes:     Conjunctiva/sclera: Conjunctivae normal.  Neck:     Thyroid: Thyromegaly present.     Comments: Thyroid gland seems to be asymmetric and worse on the right side than the left with a possible mass. Cardiovascular:     Rate and Rhythm: Normal rate and regular rhythm.     Heart sounds: No murmur heard. Pulmonary:     Effort: Pulmonary effort is normal. No respiratory distress.     Breath sounds: Normal breath sounds.  Musculoskeletal:     Cervical back: Neck supple.     Right lower leg: No edema.     Left lower leg: No edema.  Skin:    General: Skin is warm and dry.     Capillary Refill: Capillary refill takes less than 2 seconds.  Neurological:     General: No focal deficit present.     Mental Status: She is alert.  Psychiatric:        Mood and Affect: Mood normal.        Behavior: Behavior normal.    ED Results / Procedures / Treatments   Labs (all labs ordered are listed, but only abnormal results are displayed) Labs Reviewed  BASIC METABOLIC PANEL  CBC  TSH  D-DIMER, QUANTITATIVE  I-STAT BETA HCG BLOOD, ED ([facility], [facility], [facility] ONLY)  TROPONIN I (HIGH SENSITIVITY)  TROPONIN I (HIGH SENSITIVITY)    EKG EKG Interpretation  Date/Time:  Thursday April 03 2021 09:17:37 EDT Ventricular Rate:  58 PR Interval:  135 QRS Duration: 88 QT Interval:  455 QTC Calculation: 447 R Axis:   78 Text Interpretation: Sinus rhythm normal axis no acute ischemia Confirmed by Lorre Munroe ([id number]) on 04/03/2021 9:27:23 AM  Radiology DG Chest 2 View  Result Date: 04/03/2021 CLINICAL DATA:  Chest pain. EXAM: CHEST - 2 VIEW COMPARISON:  01/27/2020. FINDINGS: Tracheal shift to the left. This most likely secondary to a thyroid mass. Thyroid ultrasound suggested for further evaluation. Mediastinum and hilar structures normal. Heart size normal. No focal  infiltrate. No pleural effusion or pneumothorax. No acute bony abnormality. IMPRESSION: 1. Tracheal shift to the left. This is most likely secondary to a thyroid mass. Thyroid ultrasound suggested for further evaluation. 2.  No acute cardiopulmonary disease. Electronically Signed   By: Marcello Moores  Register  On: 04/03/2021 08:43   CT Soft Tissue Neck W Contrast  Result Date: 04/03/2021 CLINICAL DATA:  Possible thyroid mass on chest radiograph EXAM: CT NECK WITH CONTRAST TECHNIQUE: Multidetector CT imaging of the neck was performed using the standard protocol following the bolus administration of intravenous contrast. CONTRAST:  69mL OMNIPAQUE IOHEXOL 300 MG/ML  SOLN COMPARISON:  None. FINDINGS: Pharynx and larynx: Unremarkable.  No mass or swelling. Salivary glands: Unremarkable. Thyroid: A significantly enlarged right thyroid secondary to a mass measuring 4.5 x 5.9 x 5.9 cm. Leftward displacement of the trachea and esophagus. There is flattening of the trachea in the transverse dimension but airway remains patent. No significant substernal extension. Lymph nodes: No enlarged or abnormal density nodes. Vascular: Major neck vessels are patent. Limited intracranial: No abnormal enhancement. Visualized orbits: Unremarkable. Mastoids and visualized paranasal sinuses: No significant opacification. Skeleton: Cervical spine degenerative changes. 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 Upper chest: Included lung apices are clear. Other: None. IMPRESSION: Right thyroid mass measuring up to 5.9 cm. Leftward displacement of trachea and esophagus. Flattening of trachea with patent airway. Thyroid ultrasound recommended. No adenopathy. Electronically Signed   By: Macy Mis M.D.   On: 04/03/2021 13:58    Procedures Procedures   Medications Ordered in ED Medications  ibuprofen (ADVIL) tablet 600 mg (600 mg Oral Given 04/03/21 0947)  iohexol (OMNIPAQUE) 300 MG/ML solution 75 mL (75 mLs  Intravenous Contrast Given 04/03/21 1337)    ED Course  I have reviewed the triage vital signs and the nursing notes.  Pertinent labs & imaging results that were available during my care of the patient were reviewed by me and considered in my medical decision making (see chart for details).  Clinical Course as of 04/03/21 1436  Thu Apr 03, 2021  1312 Work-up delayed because timely thyroid u/s could not be obtained. I ordered soft tissue neck instead. [AW]  0017 I spoke with Dr. Benjamine Mola, ENT, who states patient can be followed in clinic. [AW]    Clinical Course User Index [AW] Arnaldo Natal, MD   MDM Rules/Calculators/A&P                          MYLEY BAHNER presents to the ED complaining of chest pain.  She was noted to have tracheal deviation on a chest x-ray, and thyroid ultrasound was ordered.  However, due to a significant delay in obtaining this, I ultimately ordered a CT of her neck.  This does reveal a large thyroid mass but a patent airway.  The patient is suitable for follow-up with ENT.  As far as her chest pain, this could be due to mild displacement of her trachea and any resultant sensation this could cause, musculoskeletal pain, gastritis.  She has been evaluated for PE, ACS, pneumonia. Final Clinical Impression(s) / ED Diagnoses Final diagnoses:  Thyroid mass  Chest pain, unspecified type    Rx / DC Orders ED Discharge Orders     None        Arnaldo Natal, MD 04/03/21 [phone]

## 2022-07-14 ENCOUNTER — Ambulatory Visit (INDEPENDENT_AMBULATORY_CARE_PROVIDER_SITE_OTHER): Payer: No Typology Code available for payment source | Admitting: Internal Medicine

## 2022-07-14 ENCOUNTER — Encounter: Payer: Self-pay | Admitting: Internal Medicine

## 2022-07-14 VITALS — BP 114/72 | HR 69 | Ht 65.0 in | Wt 143.2 lb

## 2022-07-14 DIAGNOSIS — E010 Iodine-deficiency related diffuse (endemic) goiter: Secondary | ICD-10-CM

## 2022-07-14 LAB — TSH: TSH: 1.04 u[IU]/mL (ref 0.35–5.50)

## 2022-07-14 LAB — T4, FREE: Free T4: 0.82 ng/dL (ref 0.60–1.60)

## 2022-07-14 NOTE — Progress Notes (Unsigned)
Name: Sabrina [name]  MRN/ DOB: [id number], 06/04/1976    Age/ Sex: 46 y.o., female     PCP: Pcp, No   Reason for Endocrinology Evaluation: Hyperthyroidism     Initial Endocrinology Clinic Visit: 07/08/2017    PATIENT IDENTIFIER: Sabrina [name] is a 46 y.o., female with a past medical history of MNG. She has followed with [facility] since 07/08/2017 for consultative assistance with management of her Hyperthyroidism.   HISTORICAL SUMMARY: The patient was first diagnosed with Hyperthyroidism in 2018 with a TSH of 0.442 u IU/mL on 07/03/2017, no concomitant T3 or T4. The patient was started on methimazole at the time but this was discontinued in 2019 due to normalization of her TSH   Patient has been diagnosed with a thyroid goiter since 2018, no imaging until her presentation to the ED in June 2022 requiring CT imaging for compressive symptoms showing a 5.9 cm right thyroid mass with leftward displacement of the esophagus and trachea.   Her paternal grandmother's sister with thyroid disease   SUBJECTIVE:    Today (07/14/2022):  Sabrina [name] is here for a follow up on MNG.   Pt has been noted with weight loss which is intentional  The pt has been having difficulty with coughing, she struggles to exhale while couging,  , with increased neck swelling for the past 3 months Denies dysphagia or SOB Denies heartburn  Denies loose stools or diarrhea         HISTORY:  Past Medical History:  Past Medical History:  Diagnosis Date  . Anemia   . Anxiety   . Depression   . Fibroids    Past Surgical History:  Past Surgical History:  Procedure Laterality Date  . MYOMECTOMY    . RECTAL EXAMINATION UNDER ANESTHESIA W/ CYSTOSCOPY     Social History:  reports that she has been smoking cigarettes. She has never used smokeless tobacco. She reports that she does not drink alcohol and does not use drugs. Family History:  Family History  Problem Relation Age  of Onset  . Mental illness Father   . Cancer Father   . Hypertension Mother   . Thyroid disease Neg Hx      HOME MEDICATIONS: Allergies as of 07/14/2022   No Known Allergies      Medication List        Accurate as of July 14, 2022  1:15 PM. If you have any questions, ask your nurse or doctor.          STOP taking these medications    albuterol 108 (90 Base) MCG/ACT inhaler Commonly known as: VENTOLIN HFA Stopped by: Dorita Sciara, MD       TAKE these medications    multivitamin tablet Take 1 tablet by mouth daily. [employer]          OBJECTIVE:   PHYSICAL EXAM: VS: BP 114/72 (BP Location: Left Arm, Patient Position: Sitting, Cuff Size: Small)   Pulse 69   Ht '5\' 5"'$  (1.651 m)   Wt 143 lb 3.2 oz (65 kg)   SpO2 98%   BMI 23.83 kg/m    EXAM: General: Pt appears well and is in NAD  Eyes: External eye exam normal without stare, lid lag or exophthalmos.  EOM intact.    Neck: General: Supple without adenopathy. Thyroid: Right /isthmic nodule palpated ~4 cm   Lungs: Clear with good BS bilat   Heart: Auscultation: RRR.  Abdomen: Normoactive bowel sounds, soft, nontender,  without masses or organomegaly palpable  Extremities:  BL LE: No pretibial edema normal ROM and strength.  Mental Status: Judgment, insight: Intact Orientation: Oriented to time, place, and person Mood and affect: No depression, anxiety, or agitation     DATA REVIEWED:   Latest Reference Range & Units 07/14/22 11:47  TSH 0.35 - 5.50 uIU/mL 1.04  T4,Free(Direct) 0.60 - 1.60 ng/dL 0.82    CT neck 04/03/2021   Pharynx and larynx: Unremarkable.  No mass or swelling.   Salivary glands: Unremarkable.   Thyroid: A significantly enlarged right thyroid secondary to a mass measuring 4.5 x 5.9 x 5.9 cm. Leftward displacement of the trachea and esophagus. There is flattening of the trachea in the transverse dimension but airway remains patent. No significant  substernal extension.   Lymph nodes: No enlarged or abnormal density nodes.   Vascular: Major neck vessels are patent.   Limited intracranial: No abnormal enhancement.   Visualized orbits: Unremarkable.   Mastoids and visualized paranasal sinuses: No significant opacification.   Skeleton: Cervical spine degenerative changes. 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   Upper chest: Included lung apices are clear.   Other: None.   IMPRESSION: Right thyroid mass measuring up to 5.9 cm. Leftward displacement of trachea and esophagus. Flattening of trachea with patent airway. Thyroid ultrasound recommended.   No adenopathy.   Old records , labs and images have been reviewed.   ASSESSMENT / PLAN / RECOMMENDATIONS:   Multinodular Goiter:  -The patient is having difficulty with coughing, she is unable to exhale during the coughing process which causes dizziness, she did experience this during her visit today -I have reviewed her CT imaging from June 2022 which showed a 5.9 cm right thyroid mass with leftward deviation of the trachea and the esophagus, there is no thyroid ultrasound, we will proceed with this -I did recommend surgical intervention based on the size of her thyroid lobe as well as symptoms -She understands that based on the thyroid ultrasound we may consider an FNA if it is recommended -A referral to general surgery has been done - TFt's normal today    I spent 25 minutes preparing to see the patient by review of recent labs, imaging and procedures, obtaining and reviewing separately obtained history, communicating with the patient/family or caregiver, ordering medications, tests or procedures, and documenting clinical information in the EHR including the differential Dx, treatment, and any further evaluation and other management    Signed electronically by: Mack Guise, MD  [facility]  [facility]  [facility] [address] [place], [place] [address] Phone: [phone] FAX: [phone]      CC: Pcp, No No address on file Phone: None  Fax: None   Return to Endocrinology clinic as below: Future Appointments  Date Time Provider [place]  07/16/2022  1:00 PM [facility] [place] [facility]  01/12/2023 11:10 AM Darlina Mccaughey, Melanie Crazier, MD [facility] None

## 2022-07-16 ENCOUNTER — Ambulatory Visit
Admission: RE | Admit: 2022-07-16 | Discharge: 2022-07-16 | Disposition: A | Discharge status: Home / Self Care | Payer: Self-pay | Source: Ambulatory Visit | Attending: Internal Medicine | Admitting: Internal Medicine

## 2022-07-16 DIAGNOSIS — E010 Iodine-deficiency related diffuse (endemic) goiter: Secondary | ICD-10-CM

## 2022-07-17 ENCOUNTER — Telehealth: Payer: Self-pay | Admitting: Internal Medicine

## 2022-07-17 NOTE — Telephone Encounter (Signed)
Please let the patient know that the thyroid ultrasound shows that she has a large cyst on the right side of her thyroid.  The cyst is mainly fluid  At this time I would recommend that she waits to follow-up with the surgeon , as they may be able to drain it and should not get a little bit that way     Thanks

## 2022-07-17 NOTE — Telephone Encounter (Signed)
Attempted to contact the patient regarding lab results and received no answer. LVM for a call back.

## 2022-07-20 NOTE — Telephone Encounter (Signed)
2 attempt to contact the patient. LVM for a call back

## 2022-07-21 NOTE — Telephone Encounter (Signed)
Patient advised and verbalized understanding 

## 2023-01-07 IMAGING — MG DIGITAL DIAGNOSTIC BILAT W/ TOMO W/ CAD
6 of 12 series · 6 of 36 positions shown · non-contrast
Comparison: None.

CLINICAL DATA: Diffuse intermittent breast pain

EXAM:
DIGITAL DIAGNOSTIC BILATERAL MAMMOGRAM WITH TOMOSYNTHESIS AND CAD
TECHNIQUE: Bilateral digital diagnostic mammography and breast tomosynthesis
was performed. The images were evaluated with computer-aided
detection.

[L MLO synth-2D]
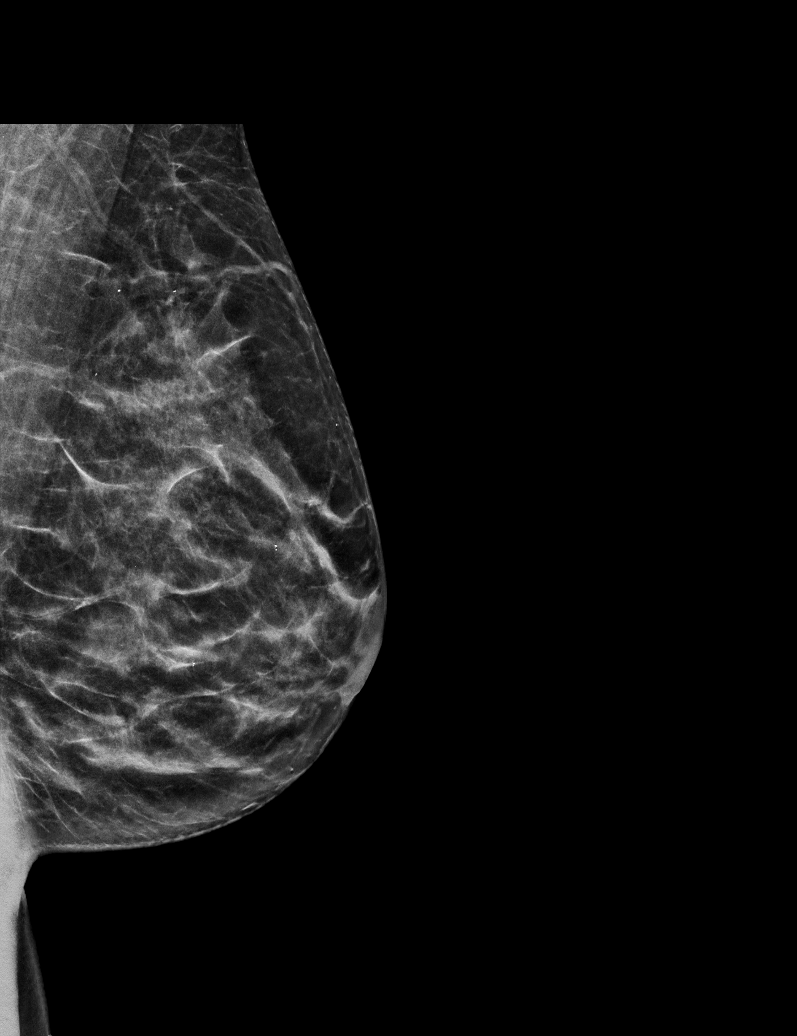

[L CC synth-2D]
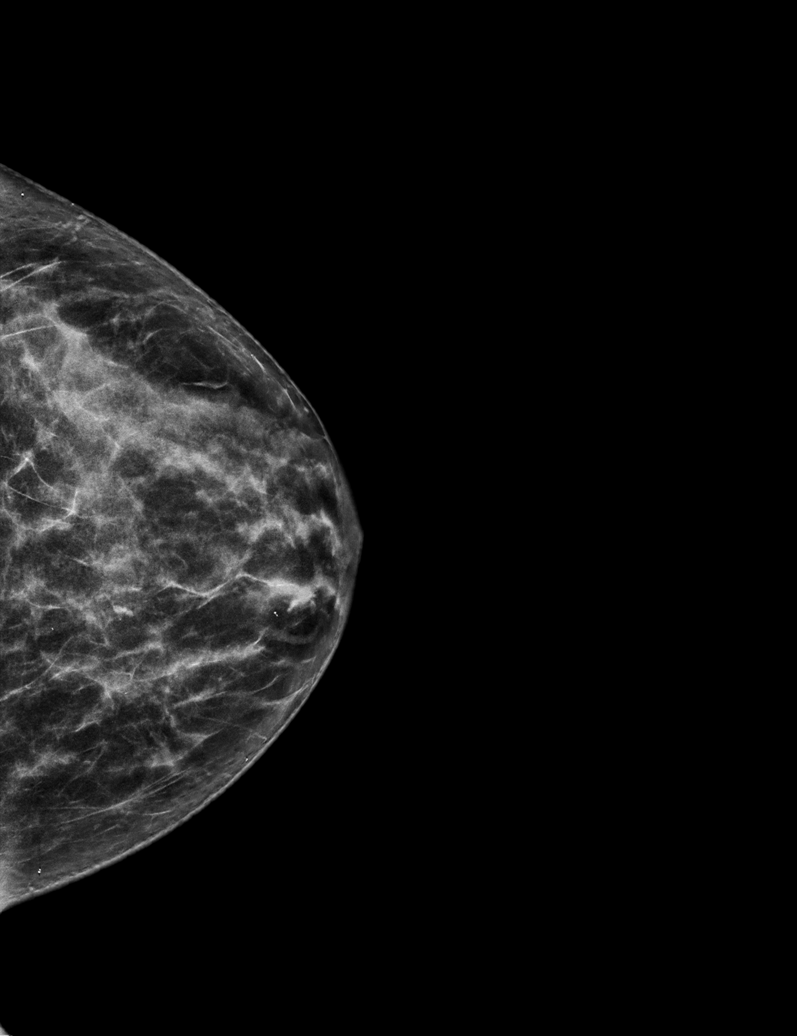

[L XCCL synth-2D]
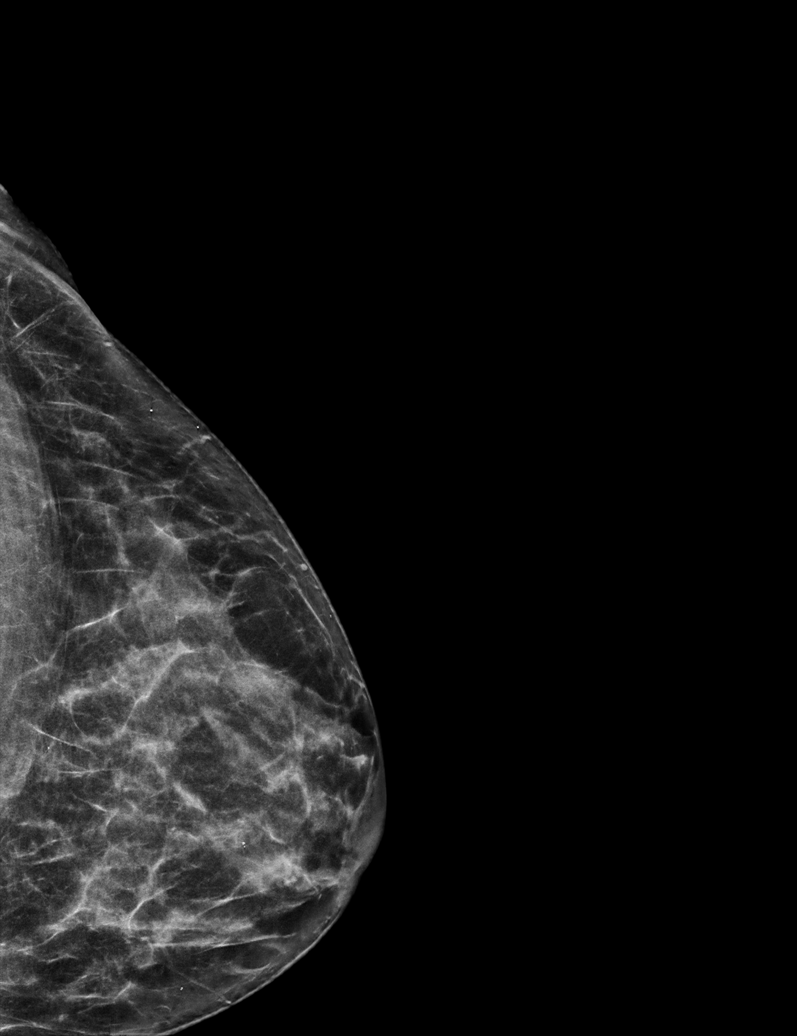

[R CC synth-2D]
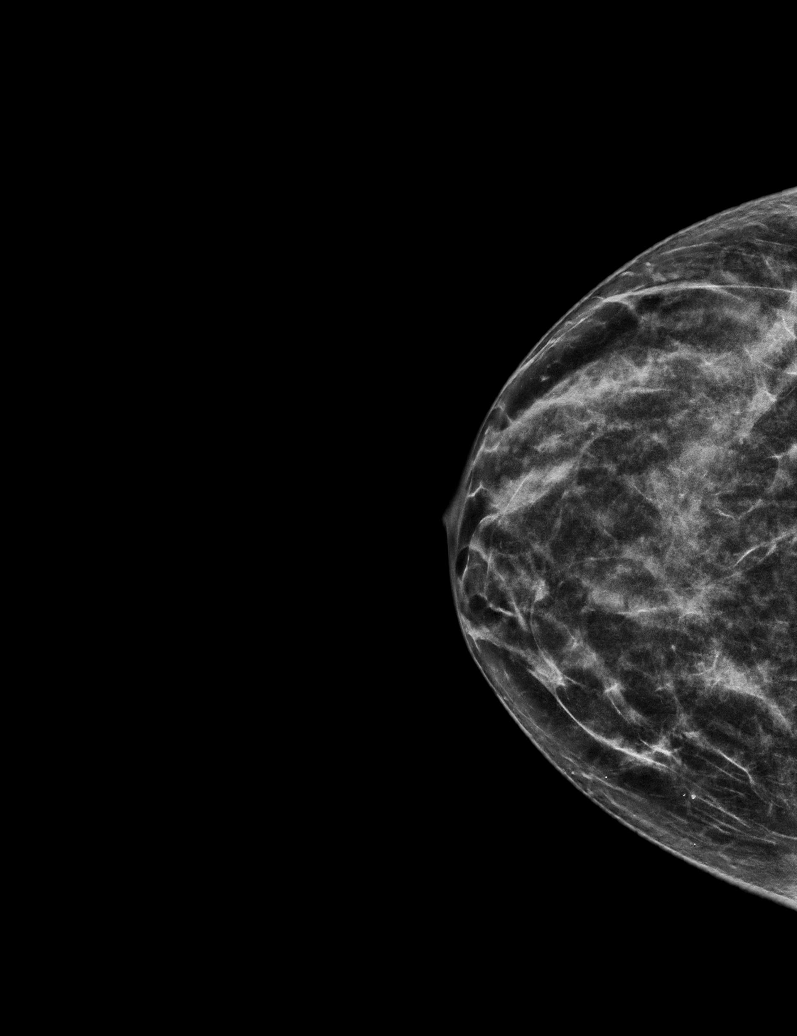

[R XCCL synth-2D]
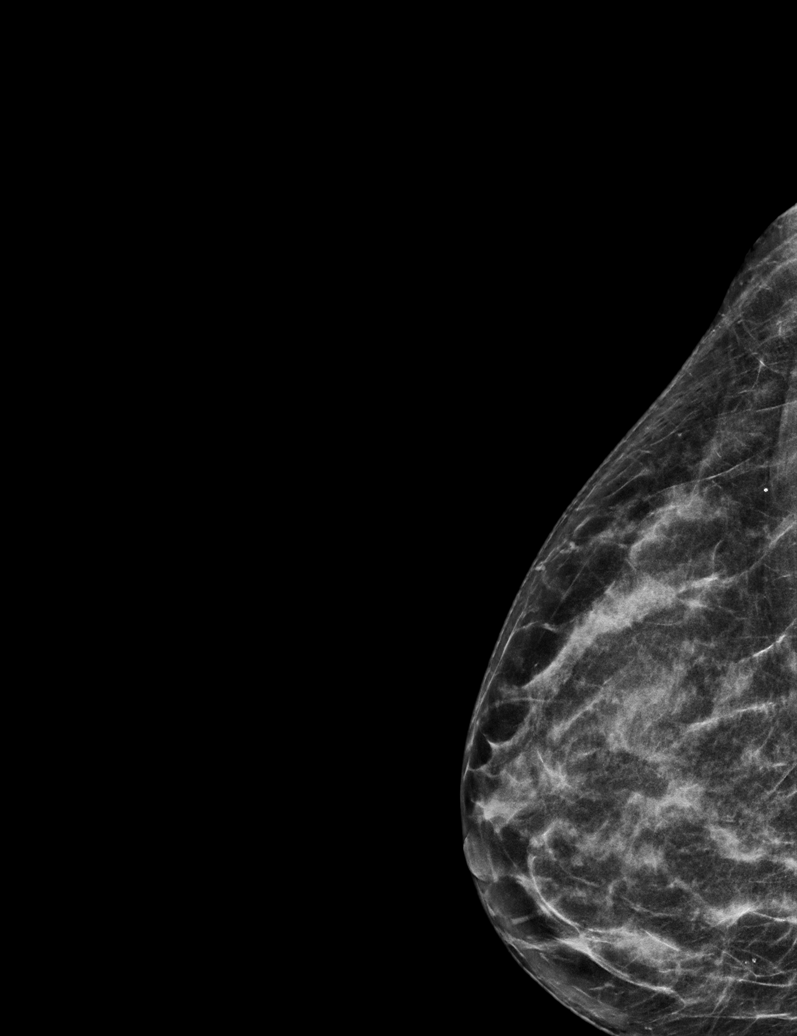

[R MLO synth-2D]
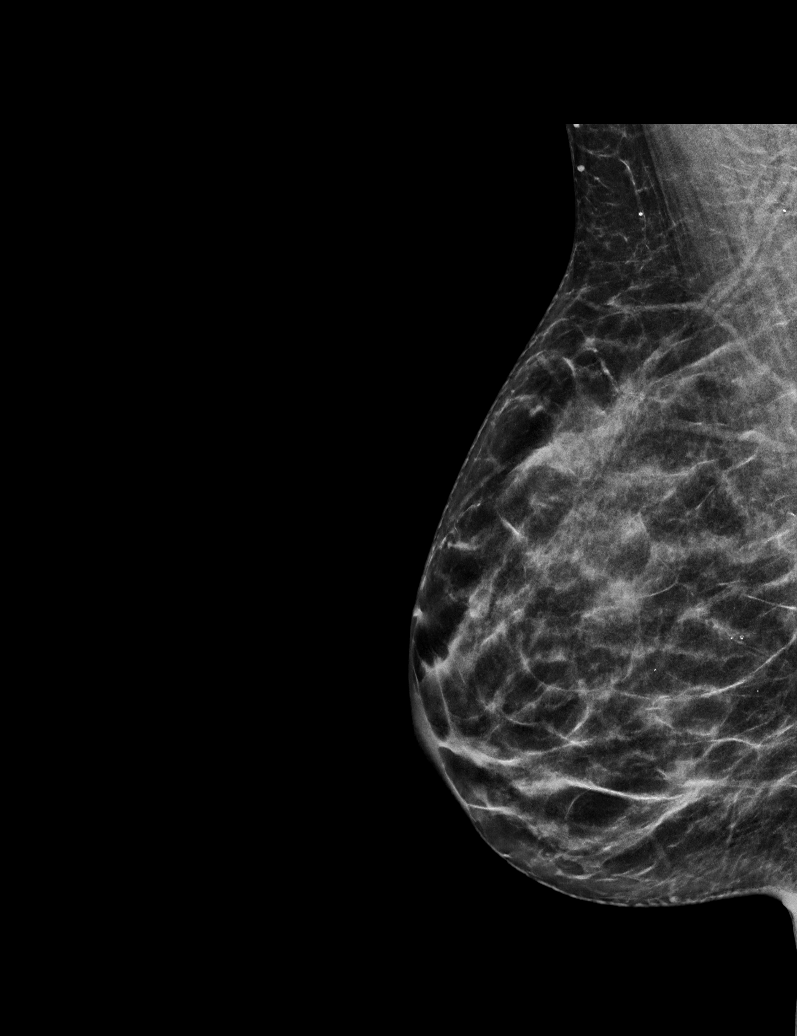

[6 of 36 positions shown; findings below may reference images not displayed]

ACR Breast Density Category c: The breast tissue is heterogeneously
dense, which may obscure small masses.
FINDINGS: No suspicious masses, calcifications, or distortion are identified
in either breast.
IMPRESSION: No mammographic evidence of malignancy.

RECOMMENDATION:
Treatment of the patient's symptoms should be based on clinical and
physical exam given lack of imaging findings. Recommend annual
screening mammography.

I have discussed the findings and recommendations with the patient.
If applicable, a reminder letter will be sent to the patient
regarding the next appointment.

BI-RADS CATEGORY  2: Benign.

## 2023-01-12 ENCOUNTER — Ambulatory Visit: Payer: No Typology Code available for payment source | Admitting: Internal Medicine

## 2023-01-12 NOTE — Progress Notes (Deleted)
Name: Sabrina [name]  MRN/ DOB: [id number], [birth year]    Age/ Sex: 47 y.o., female     PCP: Pcp, No   Reason for Endocrinology Evaluation: Hyperthyroidism     Initial Endocrinology Clinic Visit: 07/08/2017    PATIENT IDENTIFIER: Sabrina [name] is a 47 y.o., female with a past medical history of MNG. She has followed with [facility] since 07/08/2017 for consultative assistance with management of her Hyperthyroidism.   HISTORICAL SUMMARY: The patient was first diagnosed with Hyperthyroidism in 2018 with a TSH of 0.442 u IU/mL on 07/03/2017, no concomitant T3 or T4. The patient was started on methimazole at the time but this was discontinued in 2019 due to normalization of her TSH   Patient has been diagnosed with a thyroid goiter since 2018, no imaging until her presentation to the ED in June 2022 requiring CT imaging for compressive symptoms showing a 5.9 cm right thyroid mass with leftward displacement of the esophagus and trachea.   Her paternal grandmother's sister with thyroid disease    Thyroid ultrasound 07/2022 revealed a 4.9 cm cystic nodule on the right, she was referred to general surgery, for aspiration  SUBJECTIVE:    Today (01/12/2023):  Sabrina [name] is here for a follow up on MNG.   Pt has been noted with weight loss which is intentional  The pt has been having difficulty with coughing, she struggles to exhale while couging,  , with increased neck swelling for the past 3 months Denies dysphagia or SOB Denies heartburn  Denies loose stools or diarrhea         HISTORY:  Past Medical History:  Past Medical History:  Diagnosis Date   Anemia    Anxiety    Depression    Fibroids    Past Surgical History:  Past Surgical History:  Procedure Laterality Date   MYOMECTOMY     RECTAL EXAMINATION UNDER ANESTHESIA W/ CYSTOSCOPY     Social History:  reports that she has been smoking cigarettes. She has never used smokeless tobacco. She  reports that she does not drink alcohol and does not use drugs. Family History:  Family History  Problem Relation Age of Onset   Mental illness Father    Cancer Father    Hypertension Mother    Thyroid disease Neg Hx      HOME MEDICATIONS: Allergies as of 01/12/2023   No Known Allergies      Medication List        Accurate as of January 12, 2023  7:05 AM. If you have any questions, ask your nurse or doctor.          multivitamin tablet Take 1 tablet by mouth daily. [employer]          OBJECTIVE:   PHYSICAL EXAM: VS: There were no vitals taken for this visit.   EXAM: General: Pt appears well and is in NAD  Eyes: External eye exam normal without stare, lid lag or exophthalmos.  EOM intact.    Neck: General: Supple without adenopathy. Thyroid: Right /isthmic nodule palpated ~4 cm   Lungs: Clear with good BS bilat   Heart: Auscultation: RRR.  Abdomen: Normoactive bowel sounds, soft, nontender, without masses or organomegaly palpable  Extremities:  BL LE: No pretibial edema normal ROM and strength.  Mental Status: Judgment, insight: Intact Orientation: Oriented to time, place, and person Mood and affect: No depression, anxiety, or agitation     DATA REVIEWED:   Latest Reference  Range & Units 07/14/22 11:47  TSH 0.35 - 5.50 uIU/mL 1.04  T4,Free(Direct) 0.60 - 1.60 ng/dL 0.82   Thyroid ultrasound 07/16/2022  Estimated total number of nodules >/= 1 cm: 2   Number of spongiform nodules >/=  2 cm not described below (TR1): 0   Number of mixed cystic and solid nodules >/= 1.5 cm not described below ([place]): 0   _________________________________________________________   Nodule labeled 1 is a large heterogeneously cystic nodule in the right thyroid lobe measuring 4.9 x 4.0 x 3.3 cm. No appreciable solid component is identified.   Nodule labeled 2 is a small subcentimeter nodule which does not meet criteria for further dedicated follow-up or biopsy.    Nodule labeled 3 is a solid hypoechoic TR 4 nodule in the mid to inferior left thyroid lobe measuring 1.3 x 1.2 x 0.5 cm. *Given size (>/= 1 - 1.4 cm) and appearance, a follow-up ultrasound in 1 year should be considered based on TI-RADS criteria.   IMPRESSION: 1. There is a large heterogeneously cystic nodule/mass in the right thyroid lobe that measures up to 4.9 cm. Aspiration of this nodule could be performed for both therapeutic and diagnostic purposes. There is no appreciable solid component identified for biopsy. Consider ENT consultation. 2. Additional nodule labeled 3 (1.3 cm TR 4) in the left thyroid lobe meets criteria for follow-up ultrasound in 1 year. A total follow-up interval of 5 years is recommended, with this exam serving as a baseline.   ASSESSMENT / PLAN / RECOMMENDATIONS:   Multinodular Goiter:  -The patient is having difficulty with coughing, she is unable to exhale during the coughing process which causes dizziness, she did experience this during her visit today -I have reviewed her CT imaging from June 2022 which showed a 5.9 cm right thyroid mass with leftward deviation of the trachea and the esophagus, there is no thyroid ultrasound, we will proceed with this -I did recommend surgical intervention based on the size of her thyroid lobe as well as symptoms -She understands that based on the thyroid ultrasound we may consider an FNA if it is recommended -A referral to general surgery has been done - TFt's normal today    Signed electronically by: Mack Guise, MD  [facility]  [facility] [address] [place], [place] [address] Phone: [phone] FAX: [phone]      CC: Pcp, No No address on file Phone: None  Fax: None   Return to Endocrinology clinic as below: Future Appointments  Date Time Provider [place]  01/12/2023 11:10 AM Toma Arts, Melanie Crazier, MD [facility] None

## 2023-02-11 IMAGING — CT CT NECK W/ CM
4 of 5 series · 14 of 33 positions shown, 16 images · IV contrast (omnipaque)
Comparison: None.

CLINICAL DATA: Possible thyroid mass on chest radiograph

EXAM:
CT NECK WITH CONTRAST
TECHNIQUE: Multidetector CT imaging of the neck was performed using the
standard protocol following the bolus administration of intravenous
contrast.
CONTRAST:  75mL OMNIPAQUE IOHEXOL 300 MG/ML  SOLN

[Series 3: axial neck · axial · 0.46mm/px · z∈[-239,-137]mm · 3 of 103 slices shown]
[im 26/103  bone]
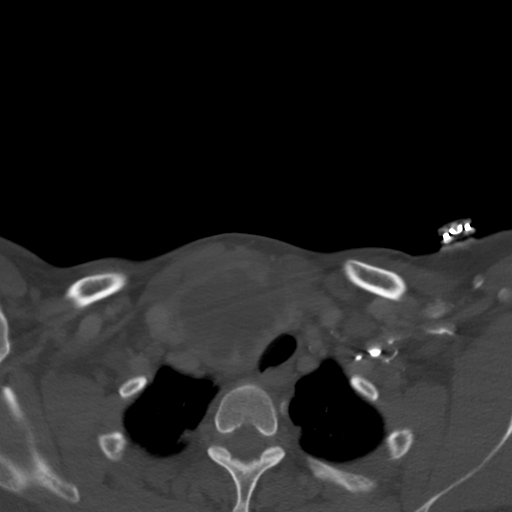
[im 52/103  bone]
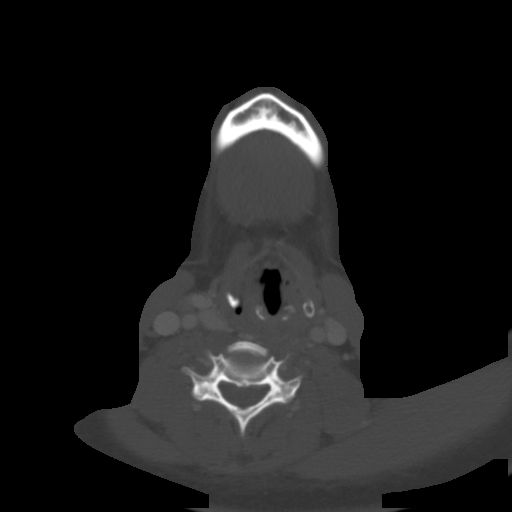
[im 77/103  bone]
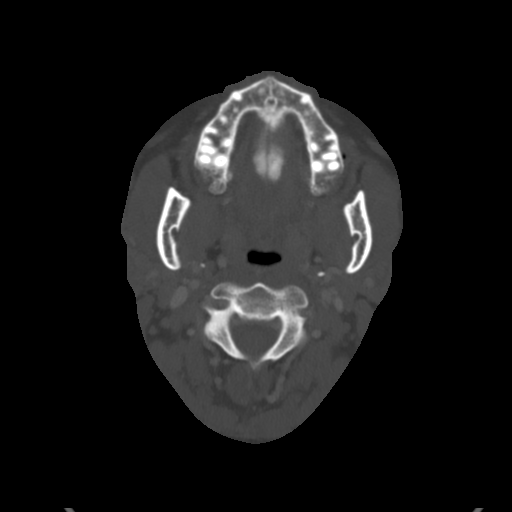

[Series 6: orthogonal (person_name) · axial · 0.39mm/px · z∈[-278,-156]mm · 3 of 125 slices shown, 4 images]
[im 32/125  soft-tissue]
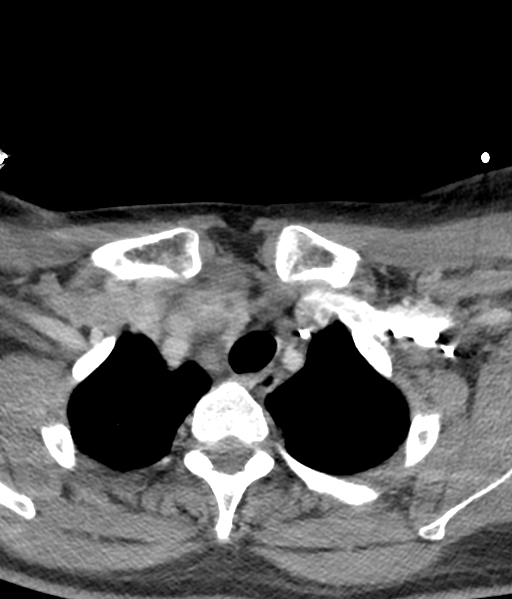
[im 32/125  bone]
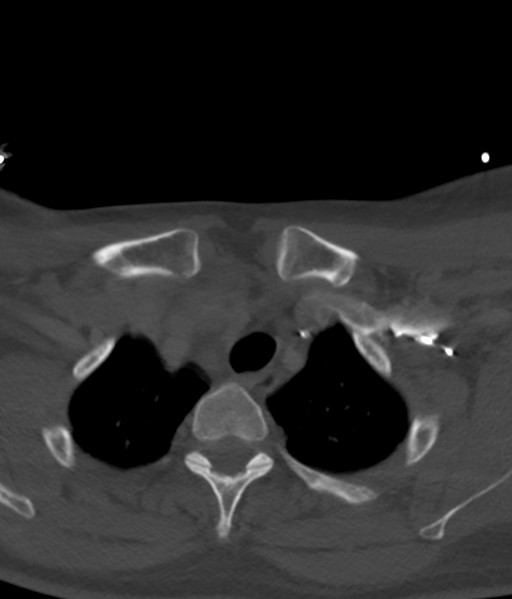
[im 63/125  bone]
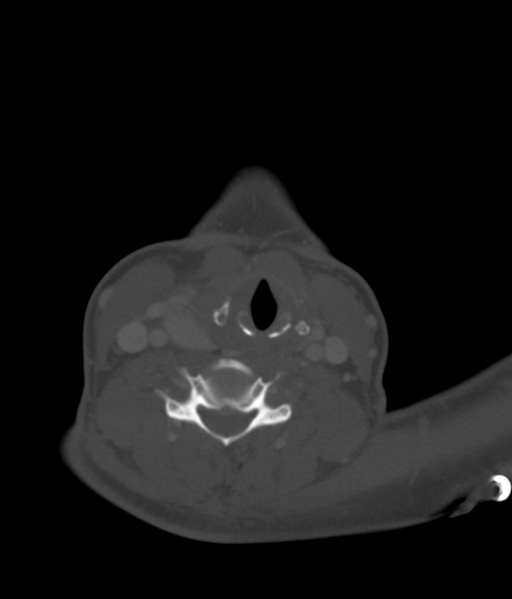
[im 94/125  bone]
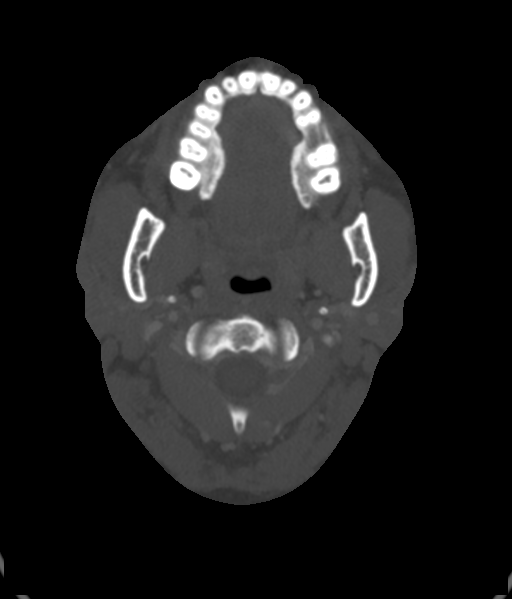

[Series 7: cor neck · coronal · 0.40mm/px · 3 of 107 slices shown]
[im 27/107  bone]
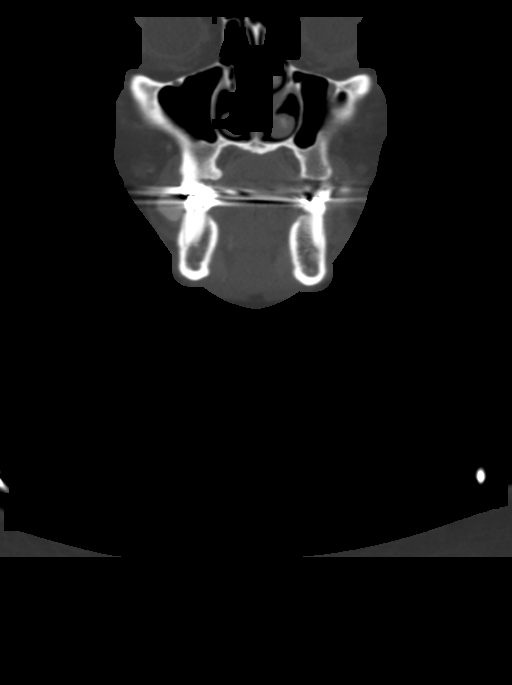
[im 45/107  bone]
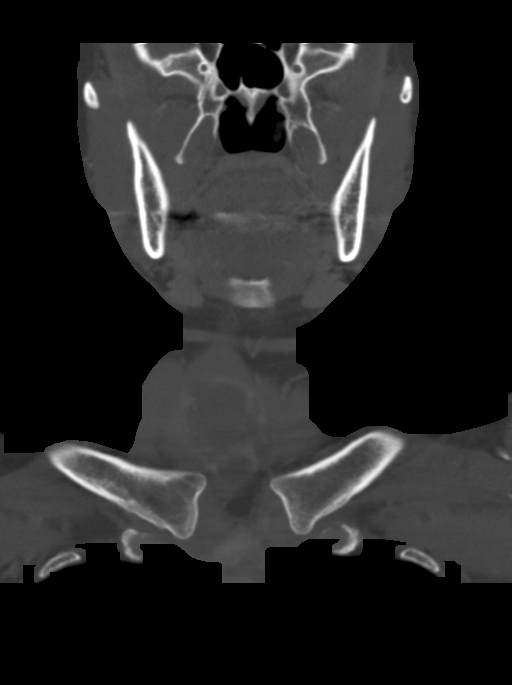
[im 62/107  bone]
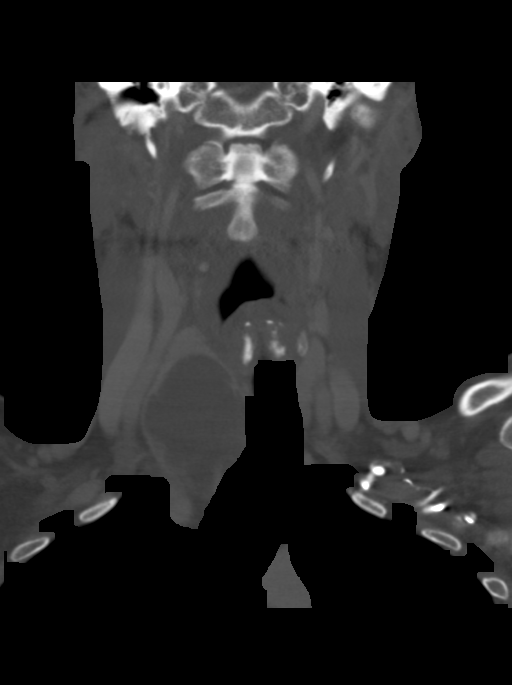

[Series 8: sag neck · sagittal · 0.44mm/px · 5 of 81 slices shown, 6 images]
[im 27/81  bone]
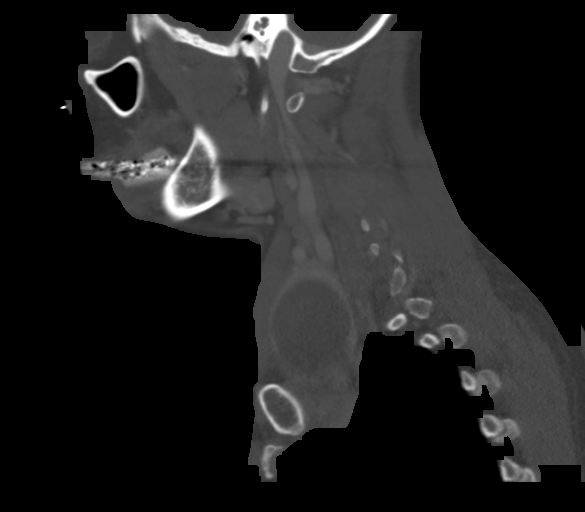
[im 34/81  bone]
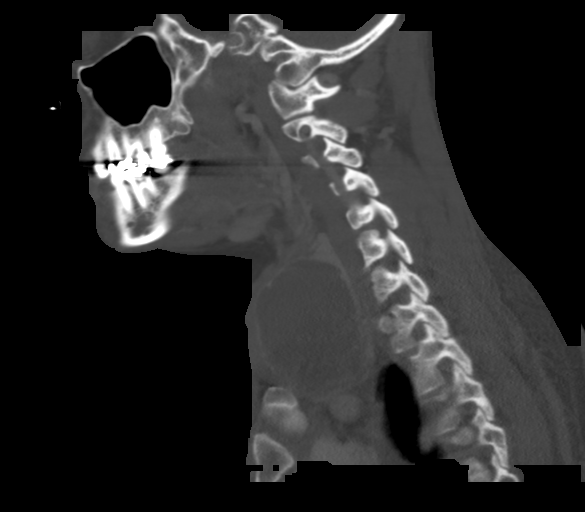
[im 41/81  soft-tissue]
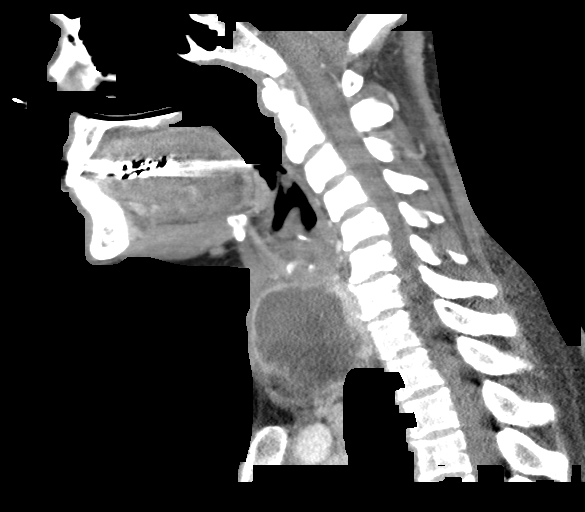
[im 41/81  bone]
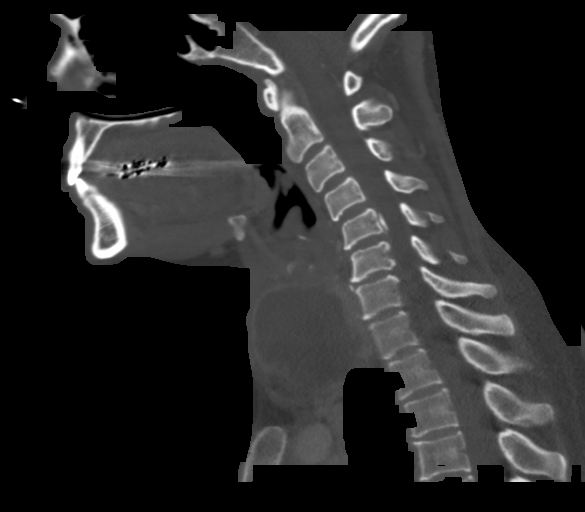
[im 47/81  bone]
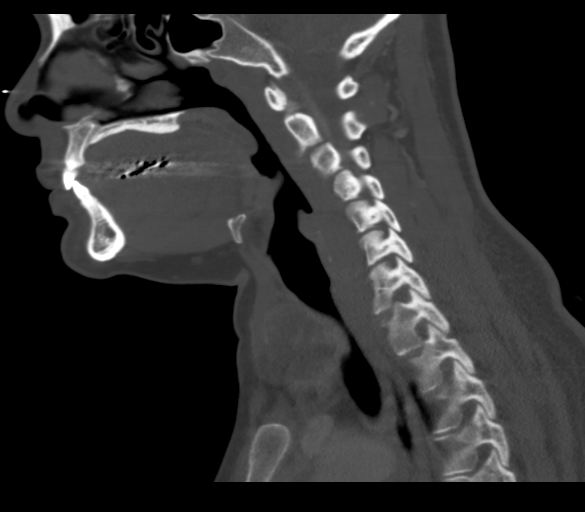
[im 54/81  bone]
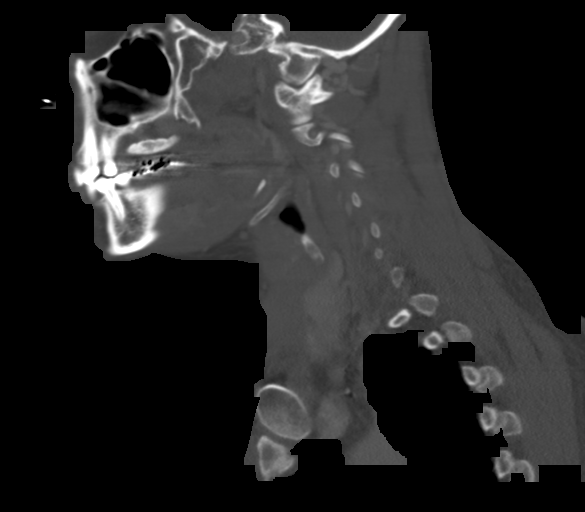

[14 of 33 positions shown; findings below may reference images not displayed]

FINDINGS: Pharynx and larynx: Unremarkable.  No mass or swelling.

Salivary glands: Unremarkable.

Thyroid: A significantly enlarged right thyroid secondary to a mass
measuring 4.5 x 5.9 x 5.9 cm. Leftward displacement of the trachea
and esophagus. There is flattening of the trachea in the transverse
dimension but airway remains patent. No significant substernal
extension.

Lymph nodes: No enlarged or abnormal density nodes.

Vascular: Major neck vessels are patent.

Limited intracranial: No abnormal enhancement.

Visualized orbits: Unremarkable.

Mastoids and visualized paranasal sinuses: No significant
opacification.

Skeleton: Cervical spine degenerative changes. There is ossification
of the posterior longitudinal ligament at C5.

Upper chest: Included lung apices are clear.

Other: None.
IMPRESSION: Right thyroid mass measuring up to 5.9 cm. Leftward displacement of
trachea and esophagus. Flattening of trachea with patent airway.
Thyroid ultrasound recommended.

No adenopathy.
